# Patient Record
Sex: Male | Born: 1960 | Race: White | Hispanic: No | Marital: Married | Smoking: Former smoker
Health system: Southern US, Community
[De-identification: ages and names within clinical notes are randomized; demographics above are authoritative.]

## PROBLEM LIST (undated history)

## (undated) DIAGNOSIS — K219 Gastro-esophageal reflux disease without esophagitis: Secondary | ICD-10-CM

## (undated) DIAGNOSIS — N529 Male erectile dysfunction, unspecified: Secondary | ICD-10-CM

## (undated) DIAGNOSIS — R6882 Decreased libido: Secondary | ICD-10-CM

## (undated) DIAGNOSIS — N400 Enlarged prostate without lower urinary tract symptoms: Secondary | ICD-10-CM

## (undated) DIAGNOSIS — D171 Benign lipomatous neoplasm of skin and subcutaneous tissue of trunk: Secondary | ICD-10-CM

## (undated) DIAGNOSIS — F419 Anxiety disorder, unspecified: Secondary | ICD-10-CM

## (undated) DIAGNOSIS — H2510 Age-related nuclear cataract, unspecified eye: Secondary | ICD-10-CM

## (undated) DIAGNOSIS — R41840 Attention and concentration deficit: Secondary | ICD-10-CM

## (undated) DIAGNOSIS — E785 Hyperlipidemia, unspecified: Secondary | ICD-10-CM

## (undated) DIAGNOSIS — I10 Essential (primary) hypertension: Secondary | ICD-10-CM

## (undated) HISTORY — DX: Decreased libido: R68.82

## (undated) HISTORY — PX: COLONOSCOPY: SHX174

## (undated) HISTORY — DX: Age-related nuclear cataract, unspecified eye: H25.10

## (undated) HISTORY — DX: Benign prostatic hyperplasia without lower urinary tract symptoms: N40.0

## (undated) HISTORY — PX: OTHER SURGICAL HISTORY: SHX169

## (undated) HISTORY — DX: Anxiety disorder, unspecified: F41.9

## (undated) HISTORY — DX: Male erectile dysfunction, unspecified: N52.9

## (undated) HISTORY — DX: Hyperlipidemia, unspecified: E78.5

## (undated) HISTORY — PX: HERNIA REPAIR: SHX51

## (undated) HISTORY — DX: Gastro-esophageal reflux disease without esophagitis: K21.9

## (undated) HISTORY — PX: CORNEAL TRANSPLANT: SHX108

## (undated) HISTORY — DX: Essential (primary) hypertension: I10

## (undated) HISTORY — DX: Attention and concentration deficit: R41.840

## (undated) HISTORY — DX: Benign lipomatous neoplasm of skin and subcutaneous tissue of trunk: D17.1

## (undated) HISTORY — PX: CYST REMOVAL HAND: SHX6279

---

## 2001-02-07 ENCOUNTER — Emergency Department (HOSPITAL_COMMUNITY): Admission: EM | Admit: 2001-02-07 | Discharge: 2001-02-07 | Payer: Self-pay | Admitting: Emergency Medicine

## 2003-10-18 ENCOUNTER — Emergency Department (HOSPITAL_COMMUNITY): Admission: EM | Admit: 2003-10-18 | Discharge: 2003-10-18 | Payer: Self-pay | Admitting: Emergency Medicine

## 2008-03-25 IMAGING — CR DG CHEST 2V
2 series · 2 of 2 positions shown · non-contrast
Comparison: No priors

CLINICAL DATA: Pre admit for left inguinal hernia

CHEST - 2 VIEW

[view not recorded (1 of 2)]
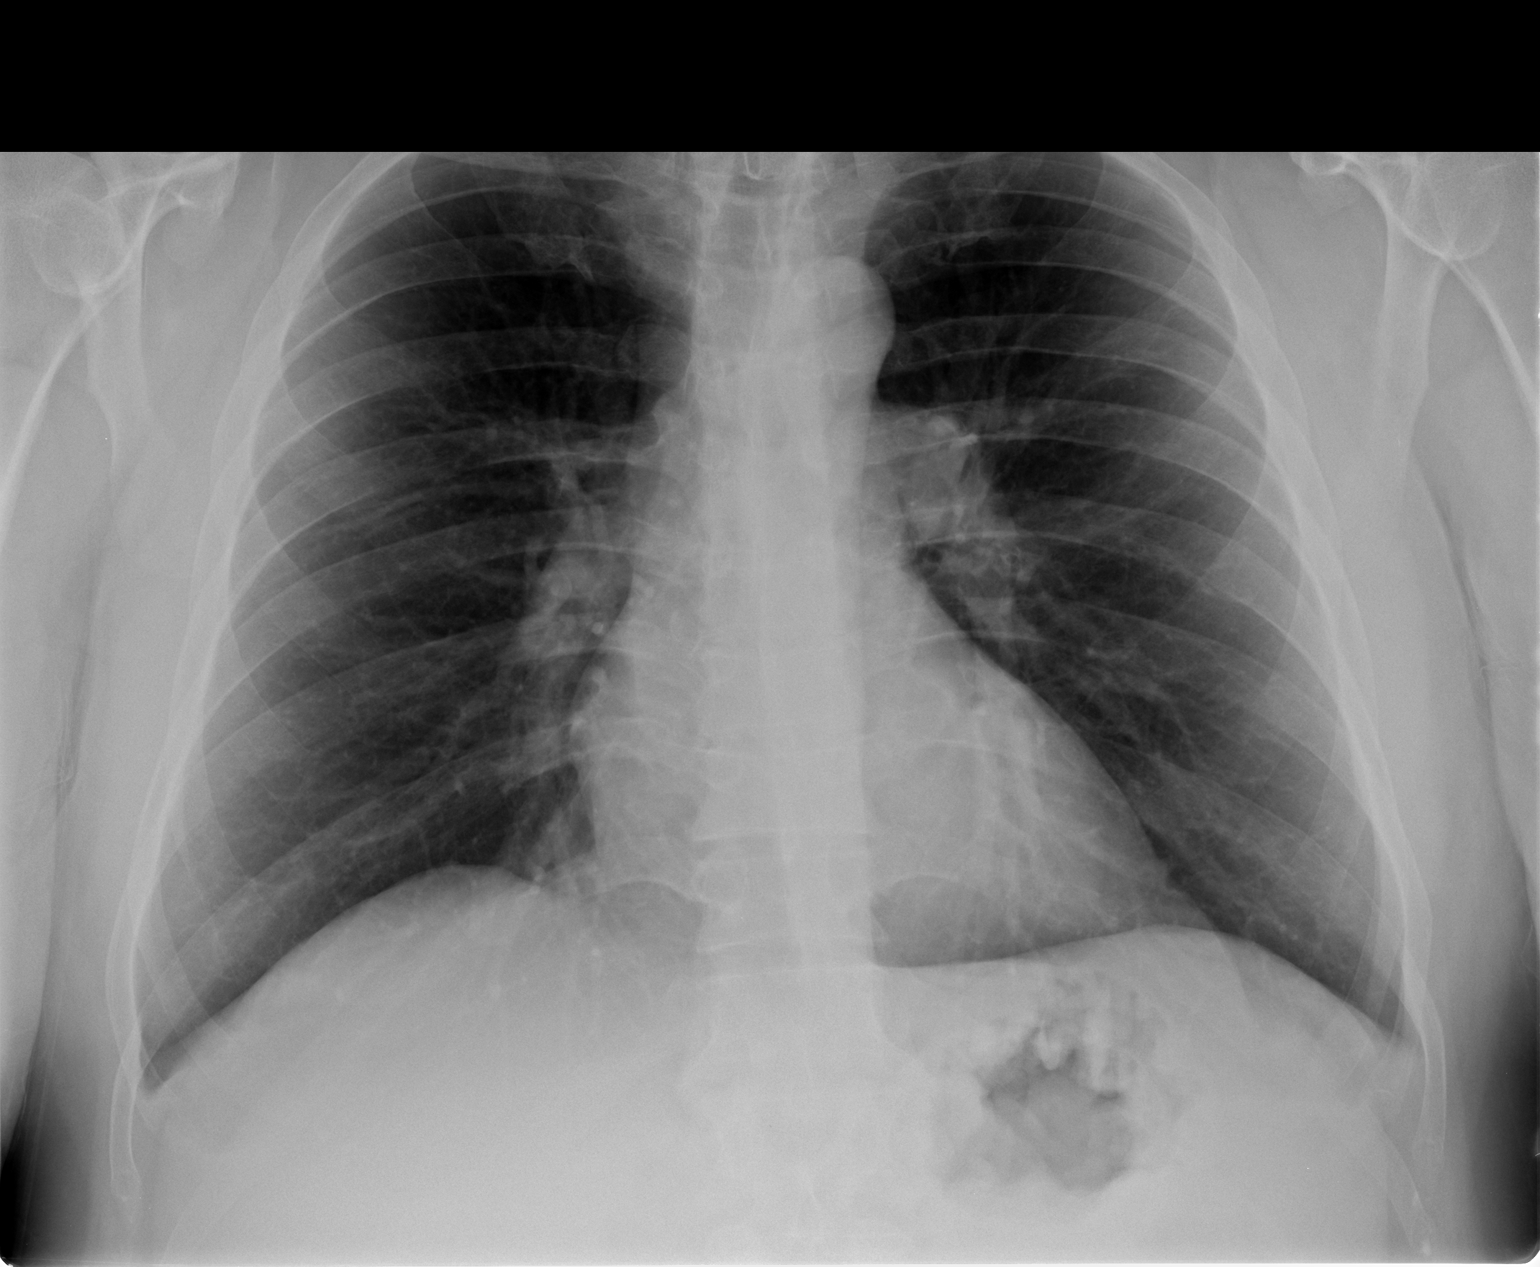

[view not recorded (2 of 2)]
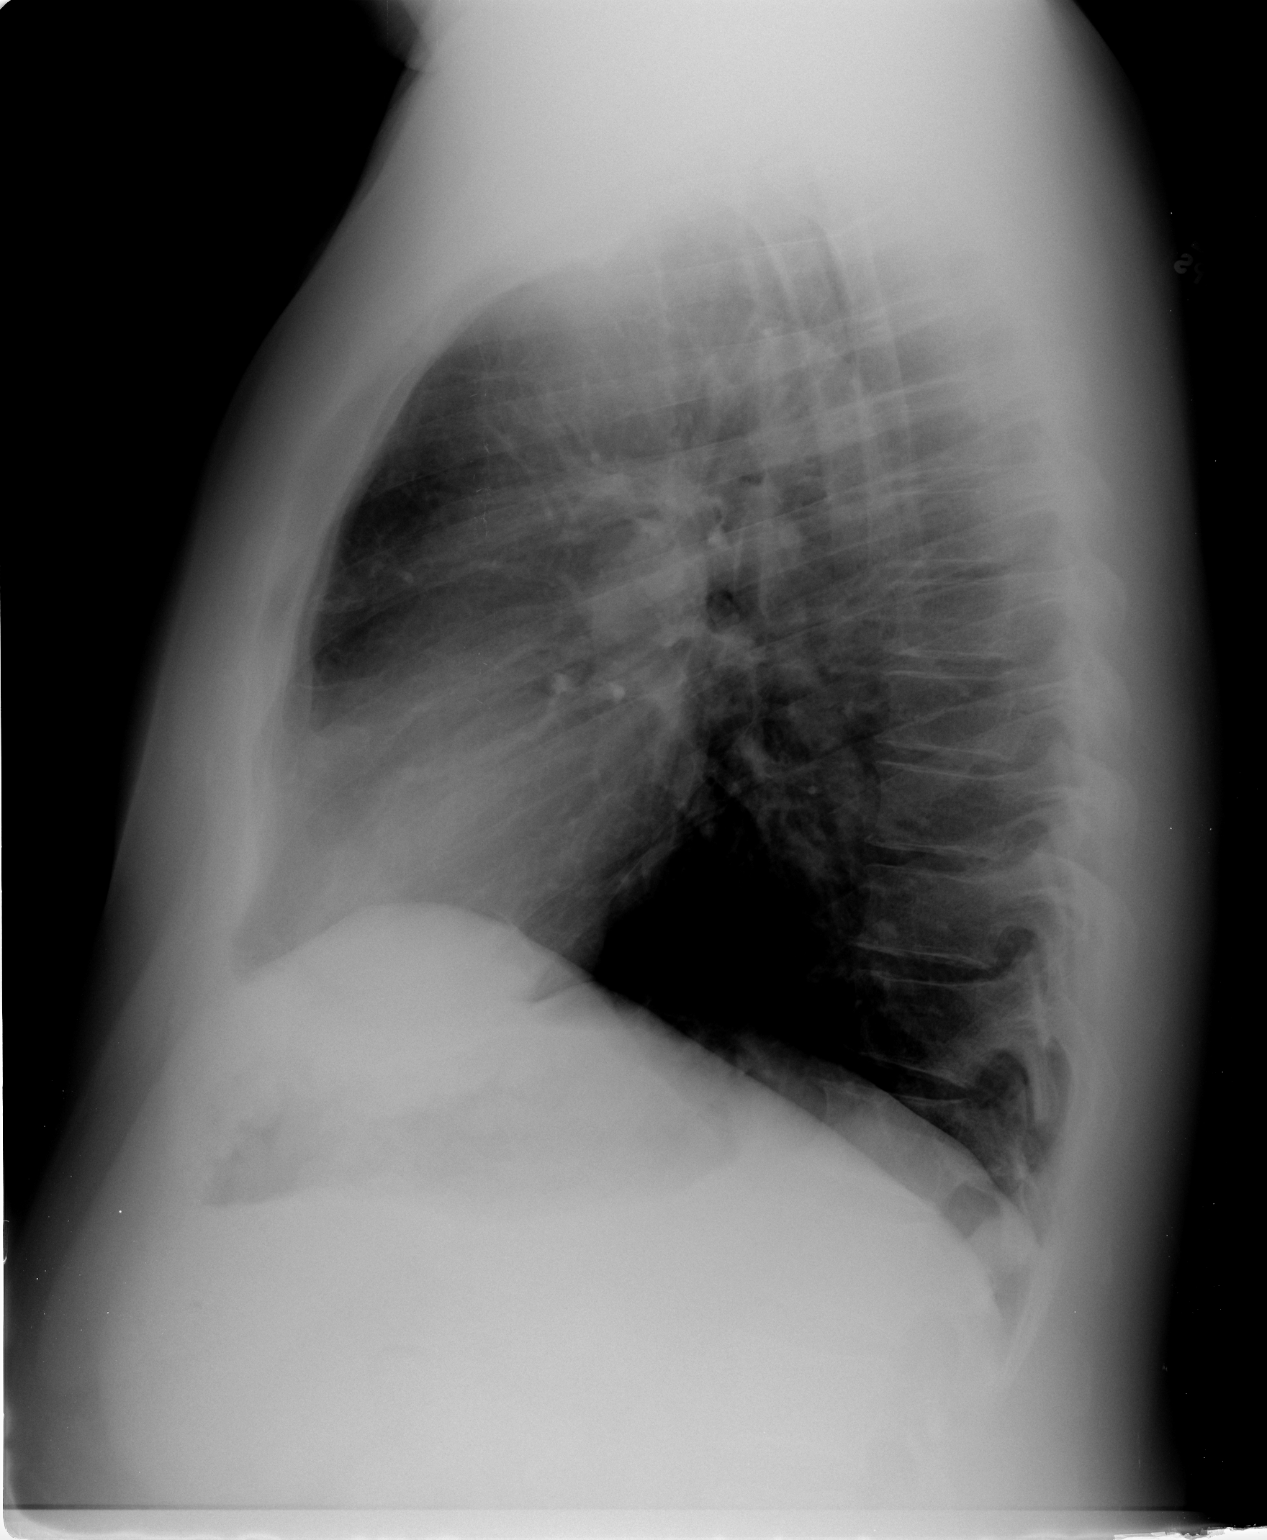

[2 of 2 positions shown; findings below may reference images not displayed]

FINDINGS: Heart and mediastinal contours normal.  Minimally
prominent left pulmonary artery which I would regard as a variant
of normal.

Lungs clear.  Osseous structures intact.
IMPRESSION: No active disease.

## 2008-03-26 ENCOUNTER — Ambulatory Visit (HOSPITAL_COMMUNITY): Admission: RE | Admit: 2008-03-26 | Discharge: 2008-03-26 | Payer: Self-pay | Admitting: Surgery

## 2008-07-01 ENCOUNTER — Encounter: Admission: RE | Admit: 2008-07-01 | Discharge: 2008-07-01 | Payer: Self-pay | Admitting: Gastroenterology

## 2008-07-01 IMAGING — CT CT PELVIS W/ CM
2 of 5 series · 17 of 46 positions shown, 19 images · IV contrast (READICAT/WATER & OMNI 300/[ID])
Comparison: None

CT ABDOMEN

CLINICAL DATA: Abdominal pain, particular the left lower quadrant
with some nausea

CT ABDOMEN AND PELVIS WITH CONTRAST
TECHNIQUE: Multidetector CT imaging of the abdomen and pelvis was
performed using the standard protocol following bolus
administration of intravenous contrast.
Contrast: 125 ml [TA]

[Series 2: abdomen w/ · axial · 0.95mm/px · z∈[-472,-46]mm · 14 of 96 slices shown, 16 images]
[im 6/96  soft-tissue]
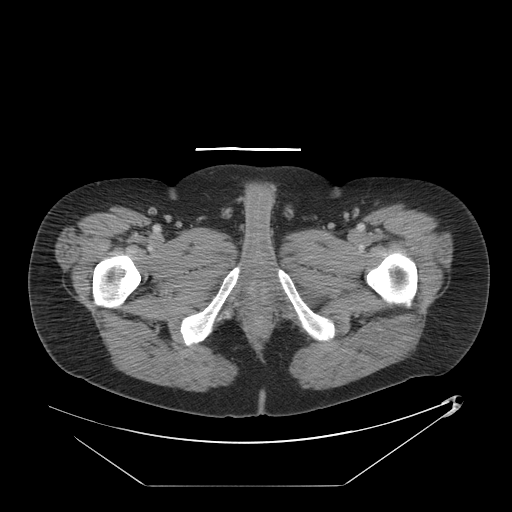
[im 6/96  bone]
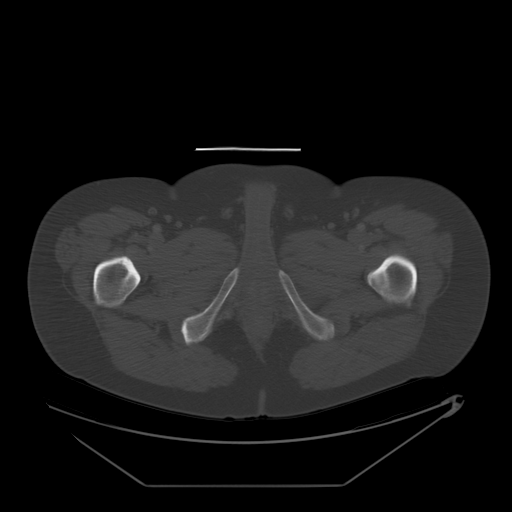
[im 11/96  soft-tissue]
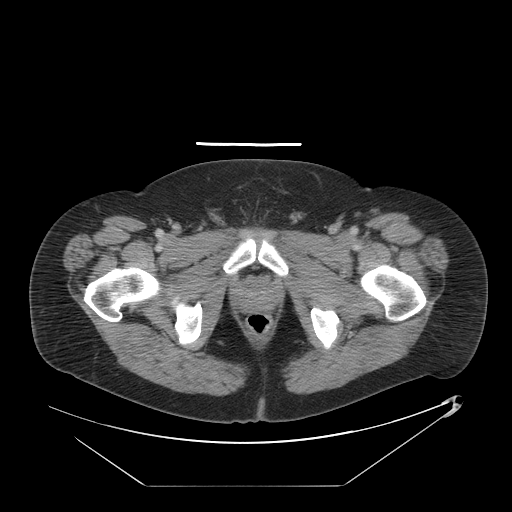
[im 21/96  soft-tissue]
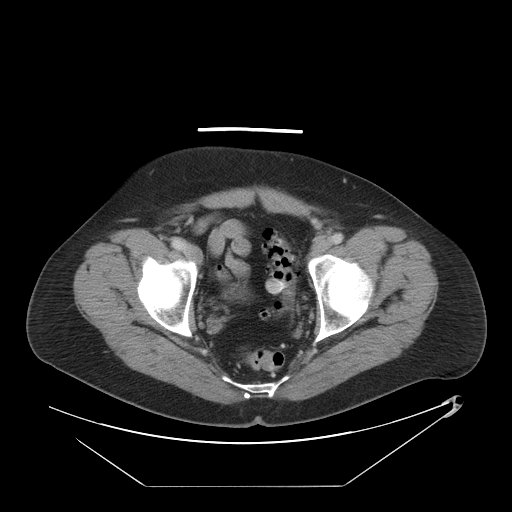
[im 26/96  soft-tissue]
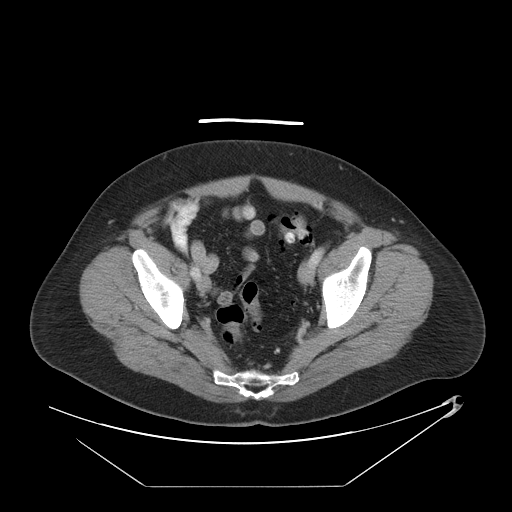
[im 31/96  soft-tissue]
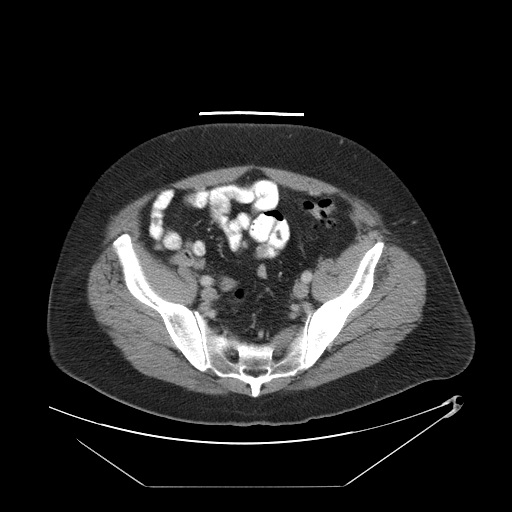
[im 41/96  soft-tissue]
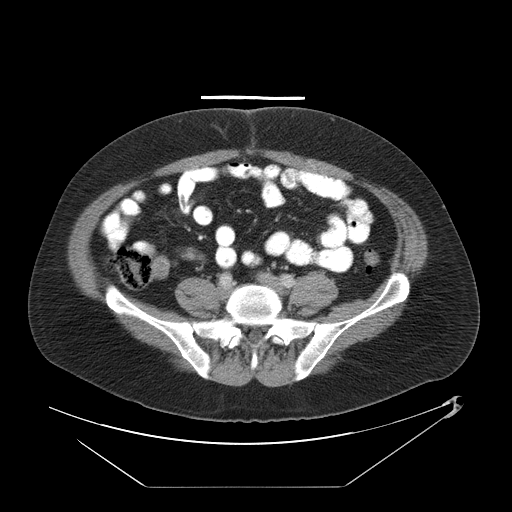
[im 46/96  soft-tissue]
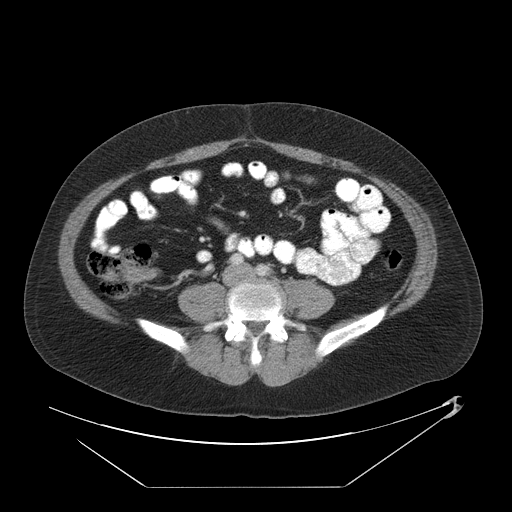
[im 51/96  soft-tissue]
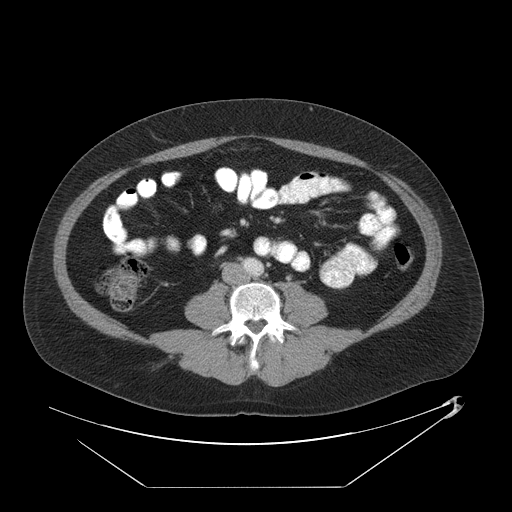
[im 56/96  soft-tissue]
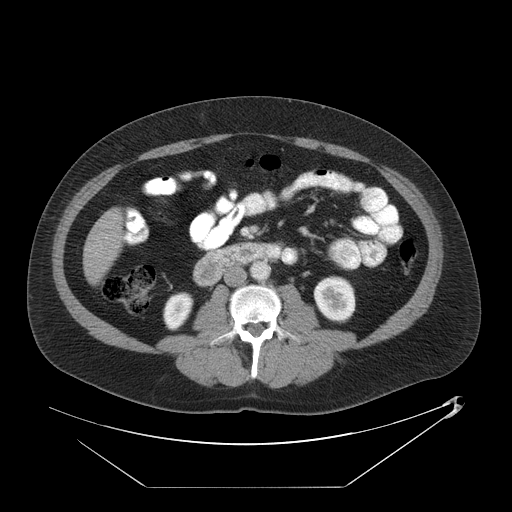
[im 56/96  bone]
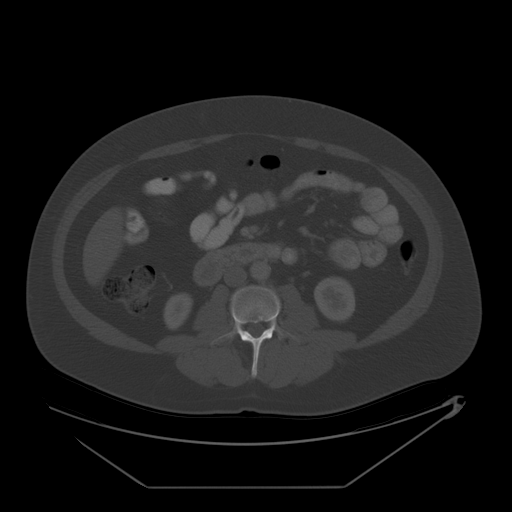
[im 66/96  soft-tissue]
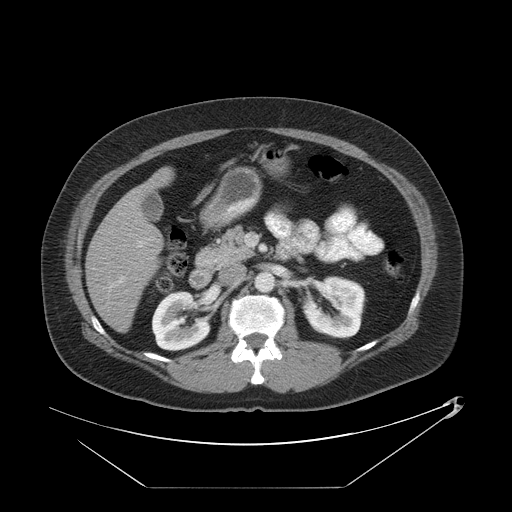
[im 71/96  soft-tissue]
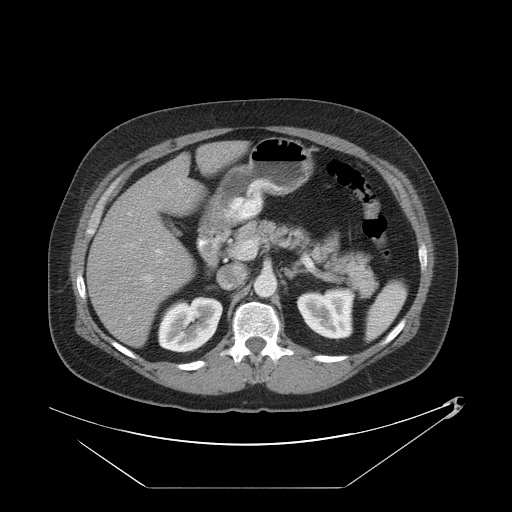
[im 76/96  soft-tissue]
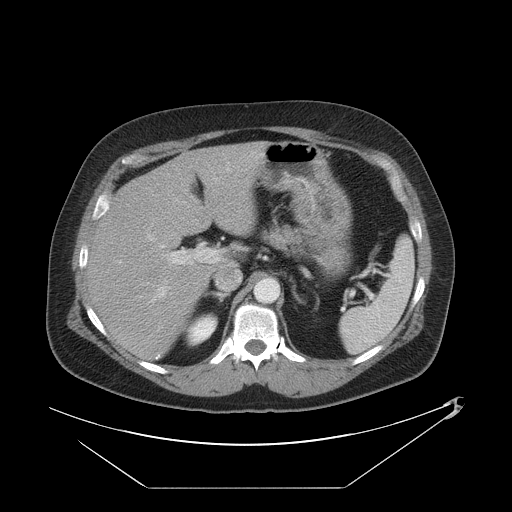
[im 86/96  soft-tissue]
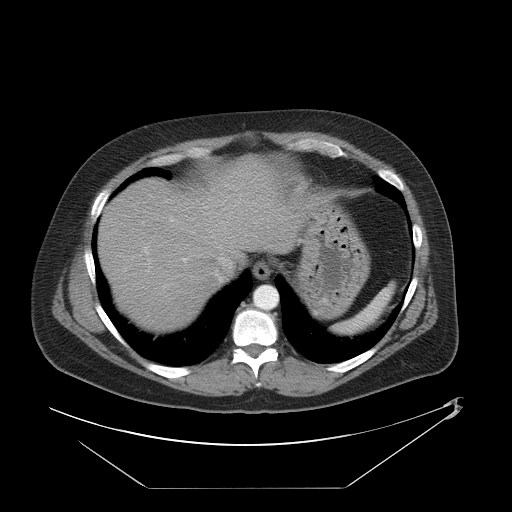
[im 91/96  soft-tissue]
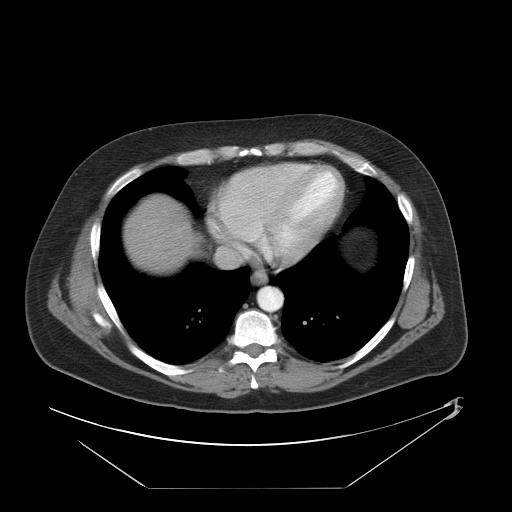

[Series 401: cor · coronal · 0.95mm/px · 3 of 122 slices shown]
[im 41/122  soft-tissue]
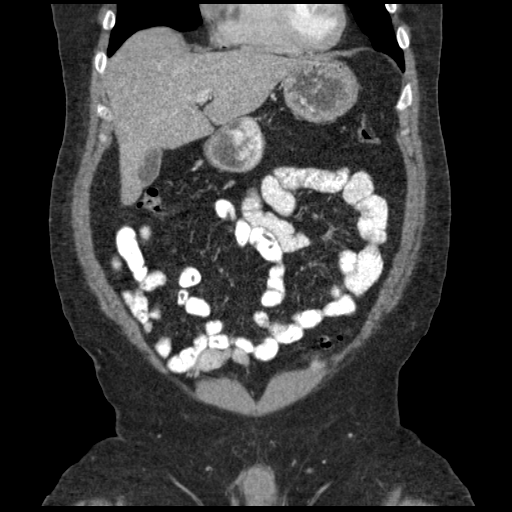
[im 54/122  soft-tissue]
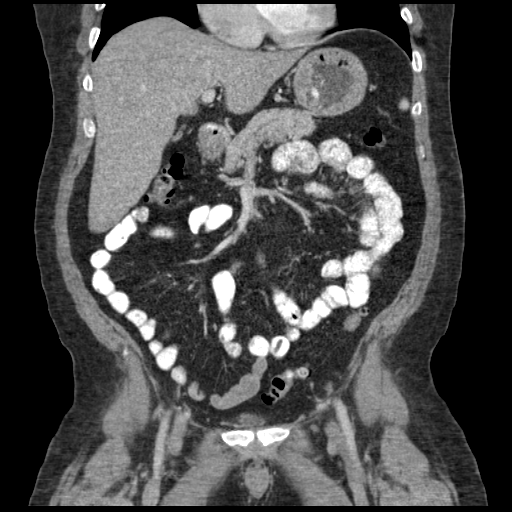
[im 68/122  soft-tissue]
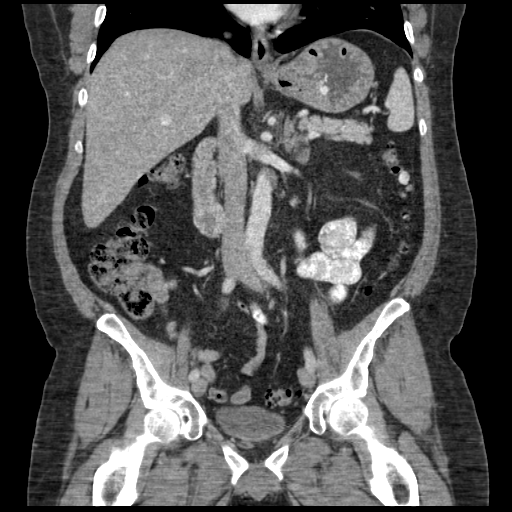

[17 of 46 positions shown; findings below may reference images not displayed]

FINDINGS: The lung bases are clear.  The liver enhances with no
focal abnormality.  There are a few small calcifications
posteriorly in the right lobe of liver possibly related to prior
granulomatous disease.  No calcified gallstones are seen.  The
pancreas is normal in size and the pancreatic duct is not dilated.
The adrenal glands and spleen appear normal.  The kidneys enhance
and on delayed images the pelvocaliceal systems appear normal.  The
abdominal aorta is normal in caliber.  No adenopathy is seen.
IMPRESSION: Negative CT the abdomen.  There are a few hepatic calcifications
possibly related to prior granulomatous disease.

CT PELVIS
FINDINGS: There are scattered diverticula throughout the colon
primarily concentrated within the rectosigmoid colon.  However, no
diverticulitis is seen.  The urinary bladder is unremarkable.  The
prostate is normal in size.  No fluid, mass, or adenopathy is noted
within the pelvis.  The appendix and terminal ileum appear normal.
No bony abnormality is seen.
IMPRESSION: 1.  Multiple scattered diverticula concentrated primarily in the
rectosigmoid colon.  No evidence of acute diverticulitis is seen.
 2.  Appendix and terminal ileum appear normal.

## 2011-01-17 NOTE — Op Note (Signed)
Chad Tran, Chad Tran                 ACCOUNT NO.:  [id number]   MEDICAL RECORD NO.:  [id number]          PATIENT TYPE:  AMB   LOCATION:  SDS                          FACILITY:  [facility]   PHYSICIAN:  Thomas A. Cornett, M.D.DATE OF BIRTH:  May 29, 1961   DATE OF PROCEDURE:  03/26/2008  DATE OF DISCHARGE:  03/26/2008                               OPERATIVE REPORT   PREOPERATIVE DIAGNOSES:  1. Left inguinal hernia.  2. Left-sided abdominal pain.   POSTOPERATIVE DIAGNOSES:  1. Left inguinal hernia.  2. Left-sided abdominal pain.   PROCEDURE:  1. Preperitoneal left inguinal hernia repair with mesh.  2. Diagnostic laparoscopy.   SURGEON:  Maisie Fus A. Cornett, MD.   ASSISTANT:  Donalda Ewings, PA student.   ANESTHESIA:  General endotracheal anesthesia 0.25% Sensorcaine local.   ESTIMATED BLOOD LOSS:  20 mL.   SPECIMENS:  None.   INDICATIONS FOR PROCEDURE:  The patient is a 50 year old male who has  been worked up with Dr. Jeani Hawking for abdominal pain.  On his workup,  he was found to have a left inguinal hernia and that was picked up on CT  scan.  This was not really noticed by the patient, nor picked up on  physical exam, so I saw him in the office, it was quite small in nature.  He continued to have left-sided pain, but given the fact that this  continued, we talked about options.  We felt that laparoscopic repair of  his hernia could be done, at the same time doing a diagnostic  laparoscopy to exclude any other cause.Marland Kitchen  He does have a history of  diverticulosis in the past.  I talked to him about all the risks as well  as potential complications of bleeding and infection.  I also talked him  about doing an open repair as well, but he liked the idea of  laparoscopic repair  to get him back to work faster and also that a  laparoscopy could be performed at the same time.  After discussing all  these with him, he agreed to proceed.   DESCRIPTION OF PROCEDURE:  The patient was brought  to the operating room  and placed supine.  After induction of general anesthesia, Foley  catheter was placed and his right arm was tucked.  The abdomen was then  prepped and draped in sterile fashion.  A 1 cm infraumbilical incision  was made.  Dissection was carried down toward his left rectus sheath.  I  opened the anterior sheath of the left rectus muscle and then retracted  the muscle laterally to expose the posterior sheath.  I then was able to  place my finger and opened the space up.  A Spacemaker balloon trocar  was then placed, and this was insufflated in the preperitoneal space  under direct vision to open up the left preperitoneal space.  It did not  open the right side very well.  After we inflated the balloon, I removed  the balloon, inflated the balloon of the trocar and insufflated to about  12 mmHg of CO2.  The left  side opened up quite nicely with the balloon.  There were some adhesions, though,  from the midline and I had to take  graspers to open this up myself directly.  After doing this, I then  dissected out the left internal ring.  Cord structures were identified.  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  His internal  ring was somewhat loose.  No evidence of an indirect hernia.  I then  inspected the right side as best I could and saw no evidence of  recurrent hernia in the right side.  We were able to see the bladder and  stay away from this.  At this point, after opening the space up and  dissecting out the cord structures, a piece of UltraPro mesh was cut  roughly into configuration of 4 inches x 6 inches.  I then cut a small  slit such that I could tuck piece of the mesh up under the pubic  tubercle, anterior to the bladder.  I then introduced the mesh, laid the  mesh out to cover the both internal ring and indirect inguinal floor, I  was able to tuck the mesh down under the  pubic tubercle and pubic  symphysis carefully.  We then laid the mesh out laterally and laid out  quite nicely.  Tisseel was used to secure the mesh and this worked very  nicely to keep the mesh in place.  Hemostasis was excellent.  I saw no  evidence of injury to the bladder or any other preperitoneal structures.  I then let the CO2 escape and washed the space close and the mesh stayed  in good position.  At this point in time, I then decided to open the  abdominal cavity.  We were able to use two Kochers and grab the midline  and pull it up.  A 15 blade was then used and I opened the fascia.  The  preperitoneal space was opened with my finger.  A pursestring suture of  0-Vicryl was placed and a 12-mm Hasson cannula was placed under direct  vision.  Pneumoperitoneum was then created to 15 mmHg CO2 in the  abdominal cavity.  We then did diagnostic laparoscopy.  The left  inguinal repair site looked fine.  The right inguinal canal showed no  evidence of hernia on the right side again.  The left upper quadrant  revealed no abnormality.  The colon, liver, and stomach were grossly  normal.  I saw no evidence of left upper quadrant hernia.  The small  bowel was grossly normal.  The right upper quadrant was normal as normal  gallbladder, normal right lobe of the liver.  I saw no explanation for  his left upper quadrant pain.  He did have diverticulosis, but no  evidence of diverticulitis.  At this point time, I terminated the  procedure and removed the scope and allowed the CO2 to escape from the  peritoneal cavity.  We then closed the fascia with 0-Vicryl.  A 4-0  Monocryl was used to close all skin incisions.  The two midline 5-mm  ports were also closed with 4-0 Monocryl.  All final counts of sponge  and needle instruments were found to be correct at this portion of the  case.  The patient was then awoke, taken to recovery in satisfactory  condition after placement of Dermabond on incisions.   All final  counts  were found to be correct x2.  The Foley catheter was removed.  The  patient tolerated the procedure well and was taken to recovery.      Thomas A. Cornett, M.D.  Electronically Signed     TAC/MEDQ  D:  03/26/2008  T:  03/27/2008  Job:  [id number]   cc:   Jordan Hawks. Elnoria Howard, MD

## 2011-06-02 LAB — DIFFERENTIAL
Basophils Absolute: 0.1
Basophils Relative: 1
Eosinophils Absolute: 0.2
Eosinophils Relative: 3
Lymphocytes Relative: 23
Lymphs Abs: 1.7
Monocytes Absolute: 0.7
Monocytes Relative: 9
Neutro Abs: 4.8
Neutrophils Relative %: 64

## 2011-06-02 LAB — CBC
HCT: 47.3
Hemoglobin: 15.7
MCHC: 33.1
MCV: 95.4
Platelets: 252
RBC: 4.96
RDW: 13.6
WBC: 7.5

## 2011-06-02 LAB — BASIC METABOLIC PANEL
BUN: 18
CO2: 24
Calcium: 9.9
Chloride: 107
Creatinine, Ser: 0.7
GFR calc Af Amer: >60
GFR calc non Af Amer: >60
Glucose, Bld: 92
Potassium: 5.1
Sodium: 138

## 2012-06-05 ENCOUNTER — Other Ambulatory Visit: Payer: Self-pay | Admitting: Family Medicine

## 2012-06-05 DIAGNOSIS — R109 Unspecified abdominal pain: Secondary | ICD-10-CM

## 2012-06-06 ENCOUNTER — Ambulatory Visit
Admission: RE | Admit: 2012-06-06 | Discharge: 2012-06-06 | Disposition: A | Discharge status: Home / Self Care | Payer: PRIVATE HEALTH INSURANCE | Source: Ambulatory Visit | Attending: Family Medicine | Admitting: Family Medicine

## 2012-06-06 DIAGNOSIS — R109 Unspecified abdominal pain: Secondary | ICD-10-CM

## 2012-06-06 IMAGING — CT CT ABD-PELV W/ CM
2 of 5 series · 16 of 46 positions shown, 18 images · IV contrast (READICAT/WATER & [ID] OMNI 300)
Comparison: CT abdomen pelvis of [DATE]

CLINICAL DATA: Left sided abdominal pain and cramping, some nausea,
worse over last 2 weeks

CT ABDOMEN AND PELVIS WITH CONTRAST
TECHNIQUE: Multidetector CT imaging of the abdomen and pelvis was
performed following the standard protocol during bolus
administration of intravenous contrast.
Contrast: 125mL OMNIPAQUE IOHEXOL 300 MG/ML  SOLN

[Series 2: abd/pelvis with · axial · 0.88mm/px · z∈[-381,+34]mm · 13 of 93 slices shown, 15 images]
[im 5/93  soft-tissue]
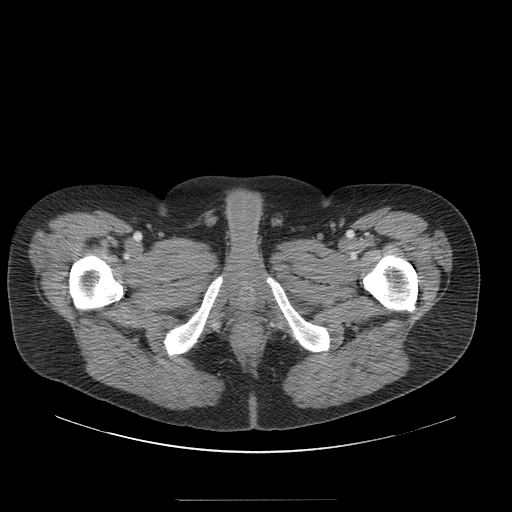
[im 5/93  bone]
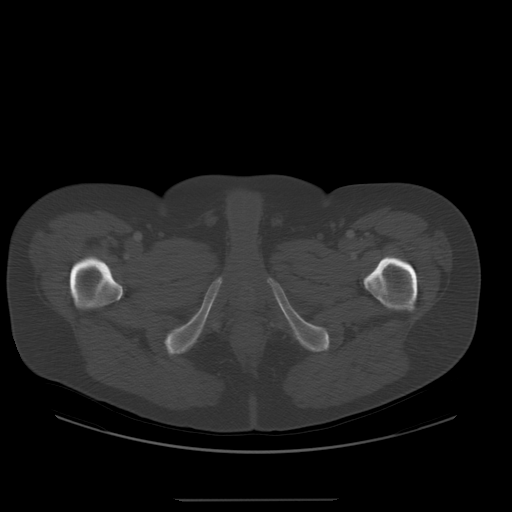
[im 15/93  soft-tissue]
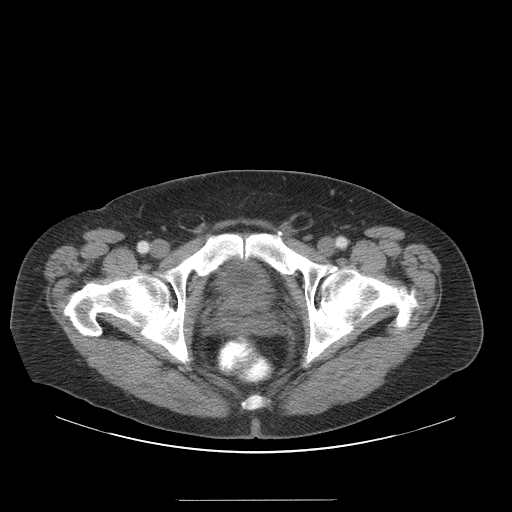
[im 20/93  soft-tissue]
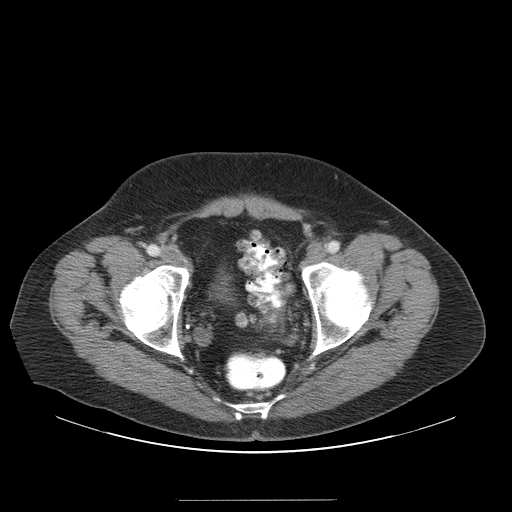
[im 25/93  soft-tissue]
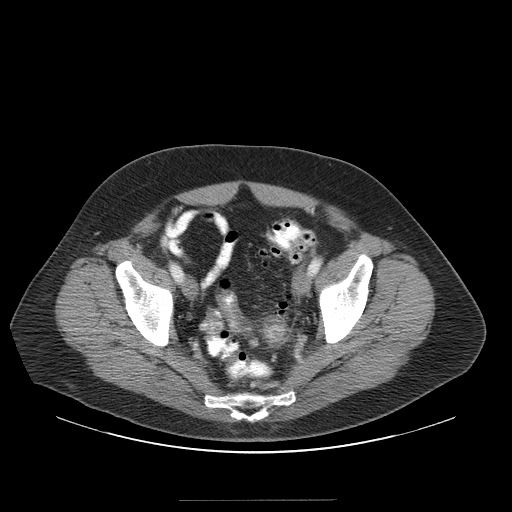
[im 34/93  soft-tissue]
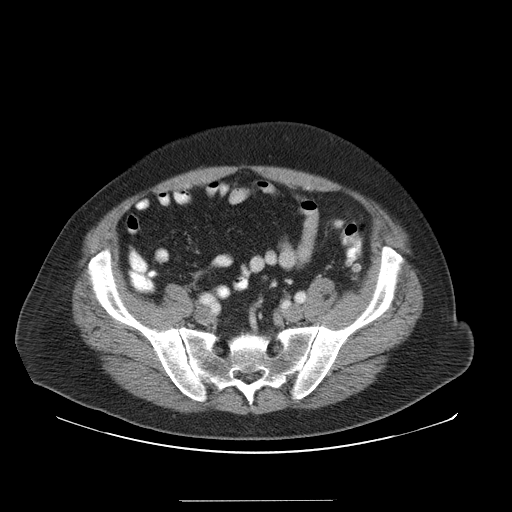
[im 39/93  soft-tissue]
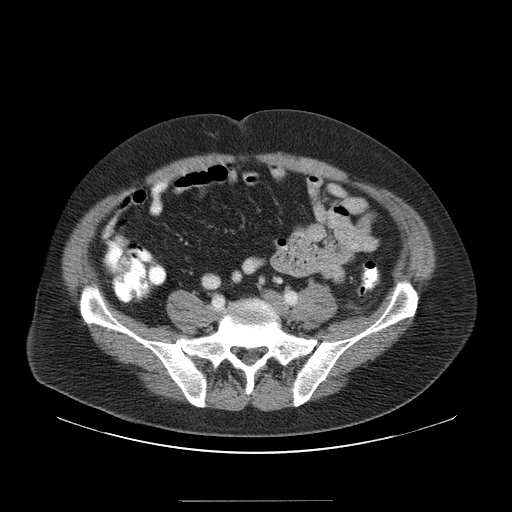
[im 49/93  soft-tissue]
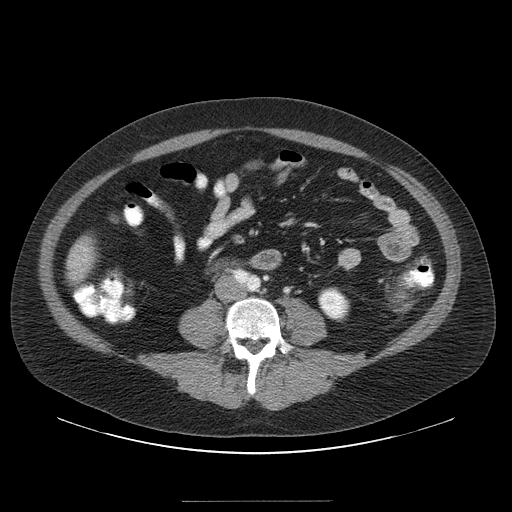
[im 54/93  soft-tissue]
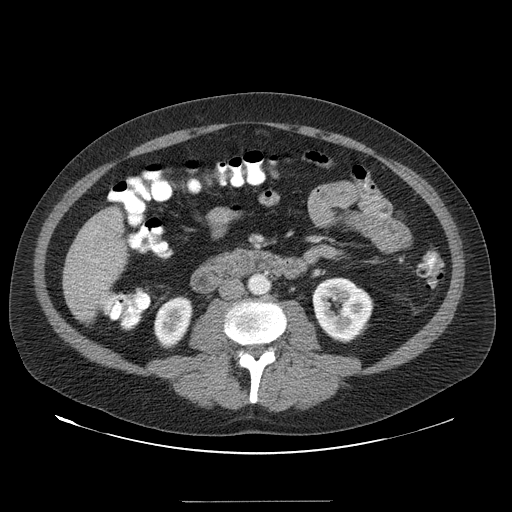
[im 59/93  soft-tissue]
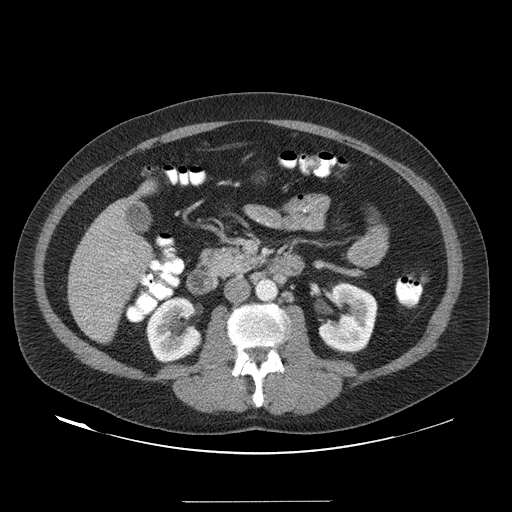
[im 59/93  bone]
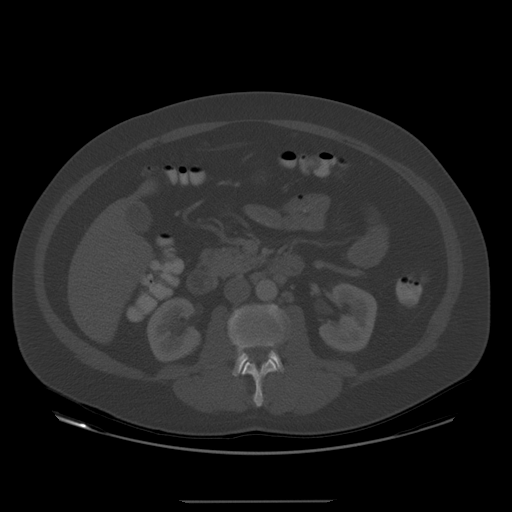
[im 68/93  soft-tissue]
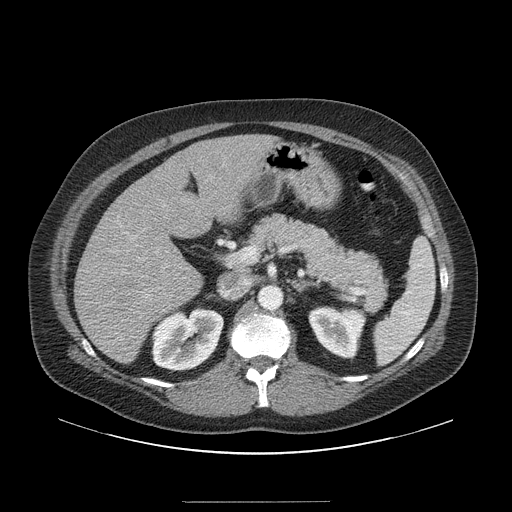
[im 73/93  soft-tissue]
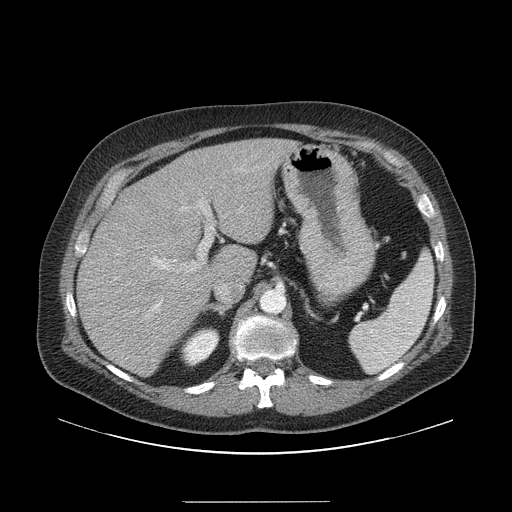
[im 78/93  soft-tissue]
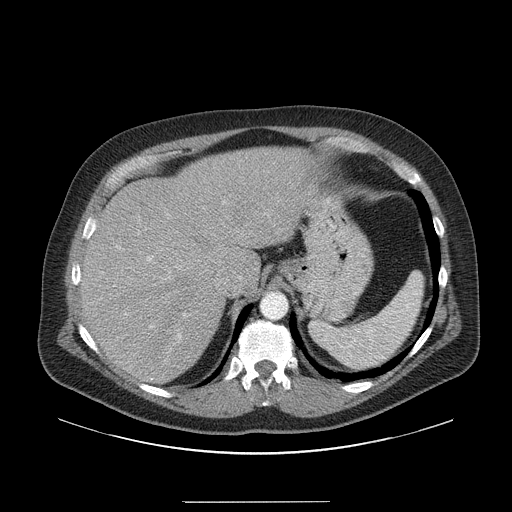
[im 88/93  soft-tissue]
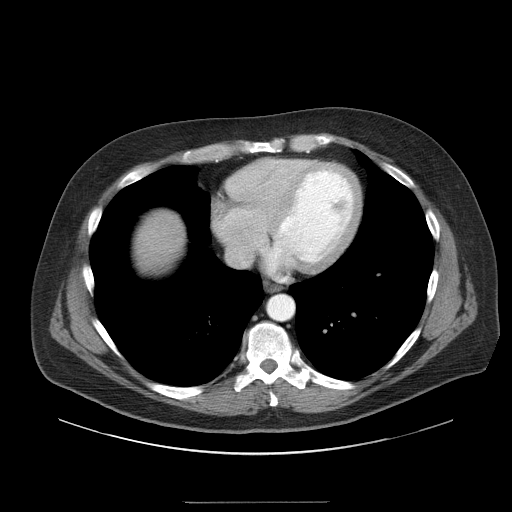

[Series 400: coronal · coronal · 0.92mm/px · 3 of 132 slices shown]
[im 44/132  soft-tissue]
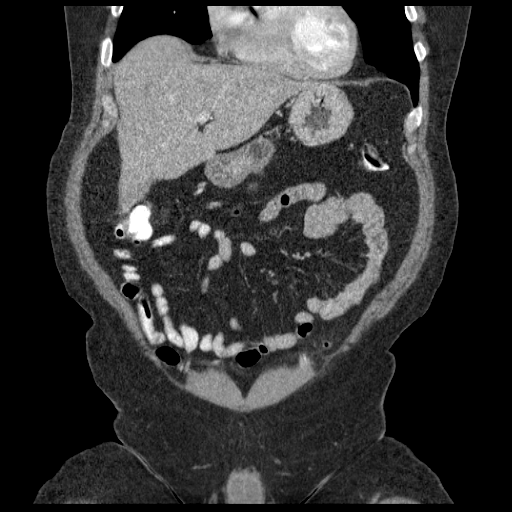
[im 59/132  soft-tissue]
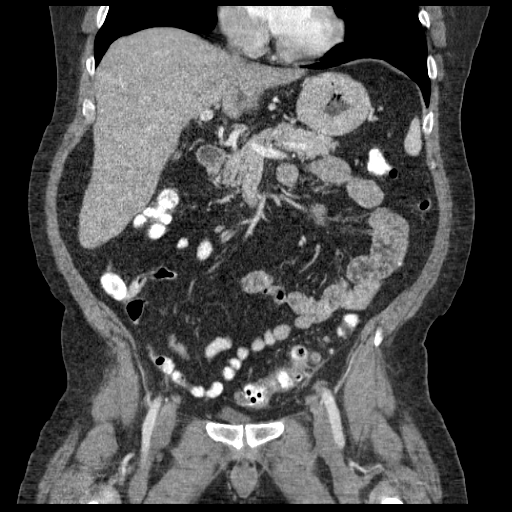
[im 73/132  soft-tissue]
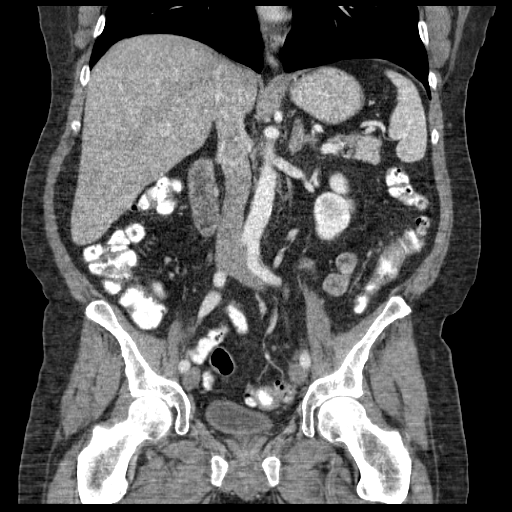

[16 of 46 positions shown; findings below may reference images not displayed]

FINDINGS: The lung bases are clear.  The liver enhances with no
focal abnormality.  The liver is somewhat low in attenuation and
fatty infiltration is a consideration. Comparison with liver
function tests is recommended. Small calcifications again noted in
the posterior right lobe most consistent with prior granulomatous
disease.    No focal abnormality is seen.  No calcified gallstones
are noted.  The pancreas is normal in size and the pancreatic duct
is not dilated.  The adrenal glands and spleen are unremarkable.
The stomach is not well distended.  The kidneys enhance with no
calculus or mass and no hydronephrosis is seen.  The abdominal
aorta is normal in caliber and the mesenteric vasculature appears
patent.  No adenopathy is seen.  Only small retroperitoneal nodes
are present.

There are multiple rectosigmoid colonic diverticula present.  There
is a focal area of the sigmoid colon where there is mild mucosal
thickening and slight pericolonic strandiness with some enhancement
of adjacent diverticula suggesting mild diverticulitis.  No
complicating features are seen.  Mild thickening of the lateral
conal fascia is noted in the left abdomen as well which may
indicate perhaps a second focus of mild diverticulitis in the mid
descending colon.  The remainder of the colon is well visualized
with no additional abnormality.  The terminal ileum and appendix
are unremarkable.  No fluid is seen within the pelvis.  The urinary
bladder is not well distended but appears unremarkable and the
prostate is within normal limits in size for age. No bony
abnormality is seen.
IMPRESSION: 1.  Probable mild acute diverticulitis of the mid descending colon
and possibly a small focus within the sigmoid colon.  No
complicating features.  Multiple rectosigmoid and descending colon
diverticula.
2.  Probable fatty infiltration of the liver.

## 2012-06-06 MED ORDER — IOHEXOL 300 MG/ML  SOLN
125.0000 mL | Freq: Once | INTRAMUSCULAR | Status: AC | PRN
  Administered 2012-06-06: 125 mL via INTRAVENOUS

## 2019-02-27 ENCOUNTER — Telehealth: Payer: Self-pay

## 2019-02-27 NOTE — Telephone Encounter (Signed)
NOTES ON FILE FROM [facility] [phone], SENT REFERRAL TO SCHEDULING °

## 2019-03-13 ENCOUNTER — Other Ambulatory Visit: Payer: Self-pay | Admitting: Physician Assistant

## 2019-03-13 DIAGNOSIS — G454 Transient global amnesia: Secondary | ICD-10-CM

## 2019-03-13 DIAGNOSIS — R42 Dizziness and giddiness: Secondary | ICD-10-CM

## 2019-03-28 ENCOUNTER — Ambulatory Visit
Admission: RE | Admit: 2019-03-28 | Discharge: 2019-03-28 | Disposition: A | Discharge status: Home / Self Care | Payer: PRIVATE HEALTH INSURANCE | Source: Ambulatory Visit | Attending: Physician Assistant | Admitting: Physician Assistant

## 2019-03-28 ENCOUNTER — Other Ambulatory Visit: Payer: Self-pay

## 2019-03-28 DIAGNOSIS — R42 Dizziness and giddiness: Secondary | ICD-10-CM

## 2019-03-28 DIAGNOSIS — G454 Transient global amnesia: Secondary | ICD-10-CM

## 2019-03-28 IMAGING — MR MRI HEAD WITHOUT CONTRAST
11 series · 48 of 48 positions shown · non-contrast
Comparison: None.

CLINICAL DATA: Transient global amnesia. Dizziness and falls for 1
month.

EXAM:
MRI HEAD WITHOUT CONTRAST
TECHNIQUE: Multiplanar, multiecho pulse sequences of the brain and surrounding
structures were obtained without intravenous contrast.

[Series 2: t1_se_sag · sagittal · 5.0mm · 0.45mm/px · 2 of 21 slices shown]
[im 1/21]
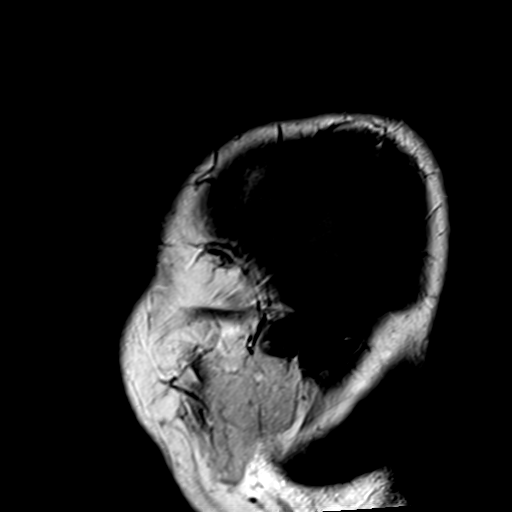
[im 21/21]
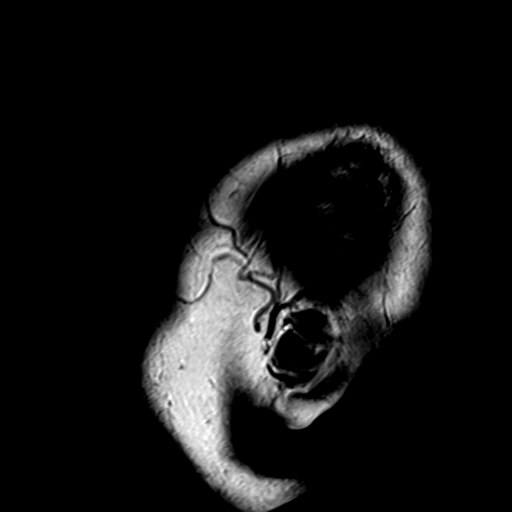

[Series 3: ep2d_diff_3 · axial · 3.0mm · 1.88mm/px · z∈[-50,+91]mm · 8 of 96 slices shown]
[im 1/96]
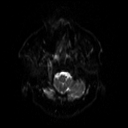
[im 14/96]
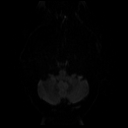
[im 28/96]
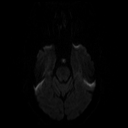
[im 41/96]
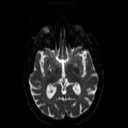
[im 55/96]
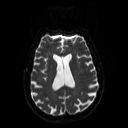
[im 68/96]
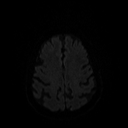
[im 82/96]
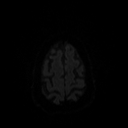
[im 96/96]
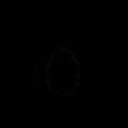

[Series 4: ep2d_diff_3_adc · axial · 3.0mm · 1.88mm/px · z∈[-50,+91]mm · 4 of 48 slices shown]
[im 1/48]
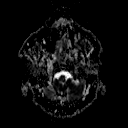
[im 16/48]
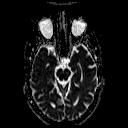
[im 32/48]
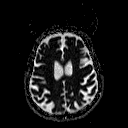
[im 48/48]
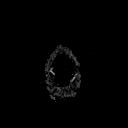

[Series 5: ep2d_diff_cor · coronal · 5.0mm · 1.77mm/px · 4 of 48 slices shown]
[im 1/48]
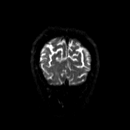
[im 16/48]
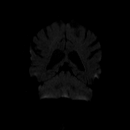
[im 32/48]
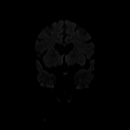
[im 48/48]
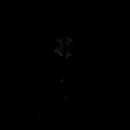

[Series 6: ep2d_diff_cor_adc · coronal · 5.0mm · 1.77mm/px · 2 of 24 slices shown]
[im 1/24]
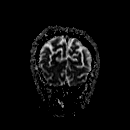
[im 24/24]
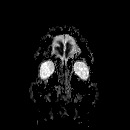

[Series 8: swi_images · axial · 2.0mm · 0.94mm/px · z∈[-58,+100]mm · 7 of 80 slices shown]
[im 1/80]
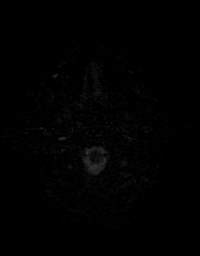
[im 14/80]
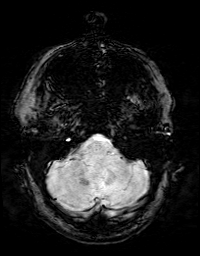
[im 27/80]
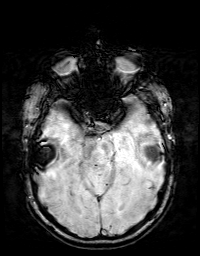
[im 40/80]
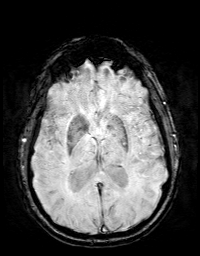
[im 53/80]
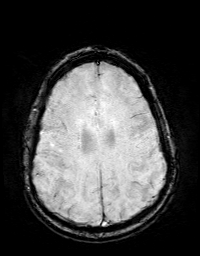
[im 66/80]
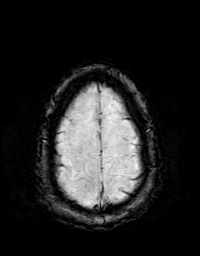
[im 80/80]
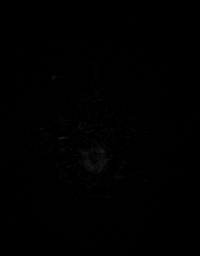

[Series 9: FLAIR · axial · 3.0mm · 0.47mm/px · z∈[-58,+98]mm · 2 of 27 slices shown (1 of 2)]
[im 1/27]
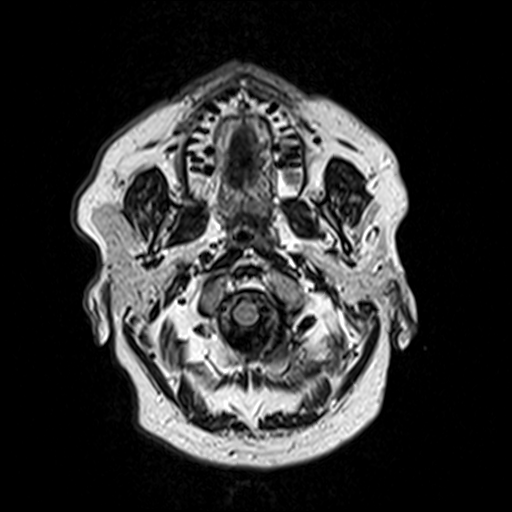
[im 27/27]
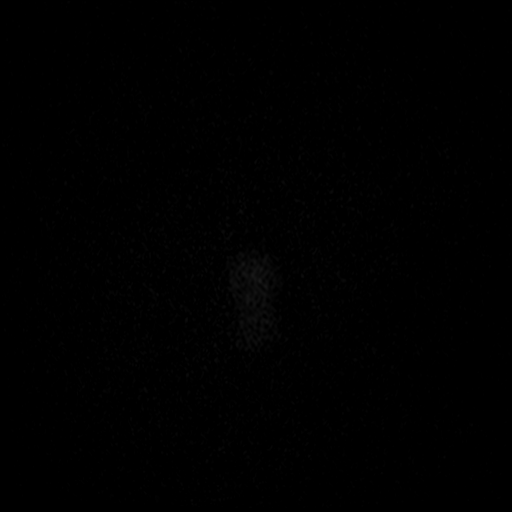

[Series 10: t2_tse_tra_512 · axial · 5.0mm · 0.62mm/px · z∈[-46,+92]mm · 2 of 24 slices shown]
[im 1/24]
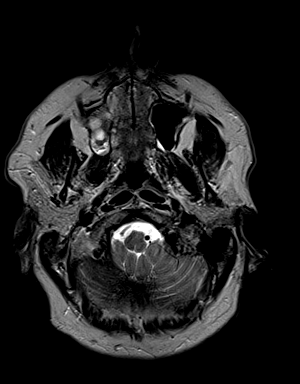
[im 24/24]
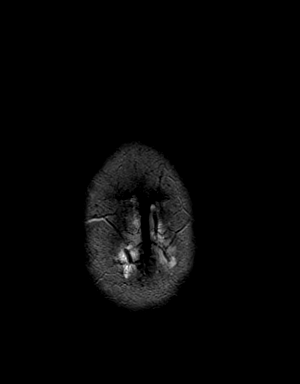

[Series 11: t1_mpr_tra · axial · 1.0mm · 0.75mm/px · z∈[-56,+103]mm · 13 of 160 slices shown]
[im 1/160]
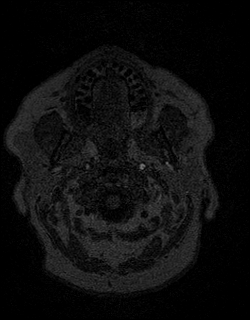
[im 14/160]
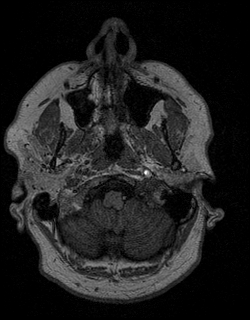
[im 27/160]
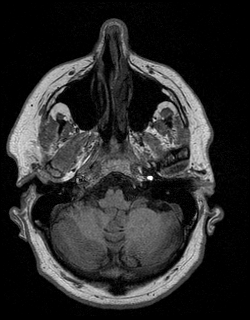
[im 40/160]
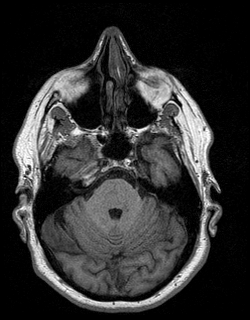
[im 54/160]
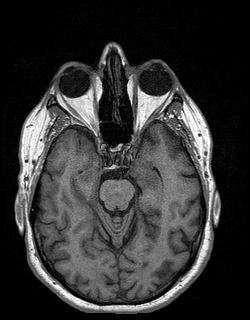
[im 67/160]
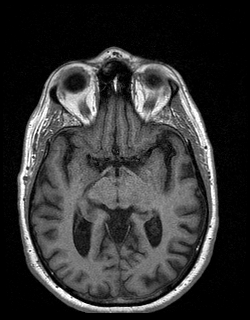
[im 80/160]
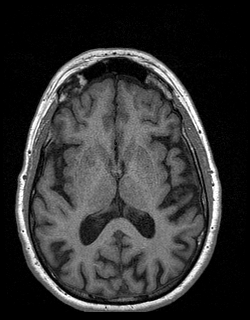
[im 93/160]
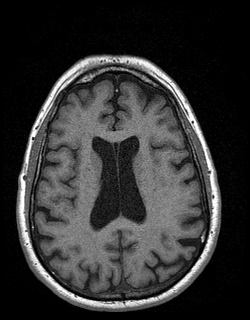
[im 107/160]
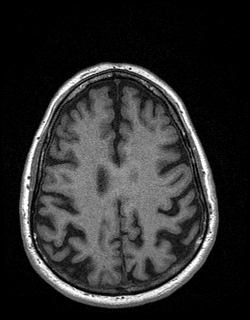
[im 120/160]
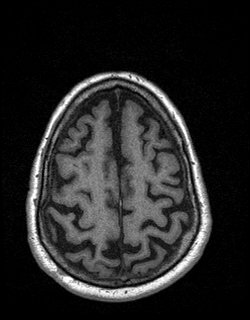
[im 133/160]
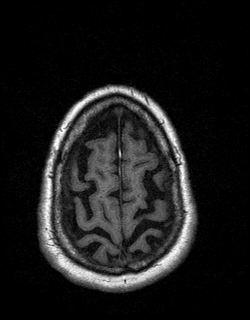
[im 146/160]
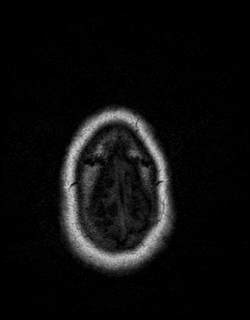
[im 160/160]
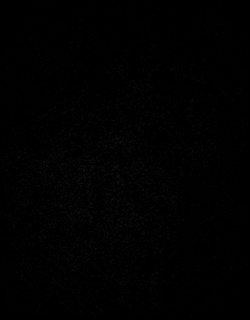

[Series 12: T2 · coronal · 5.0mm · 0.45mm/px · 2 of 26 slices shown]
[im 1/26]
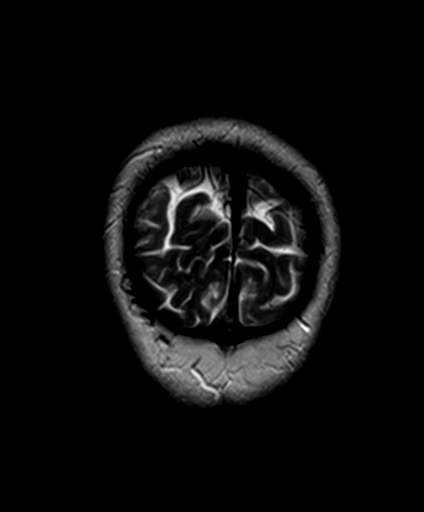
[im 26/26]
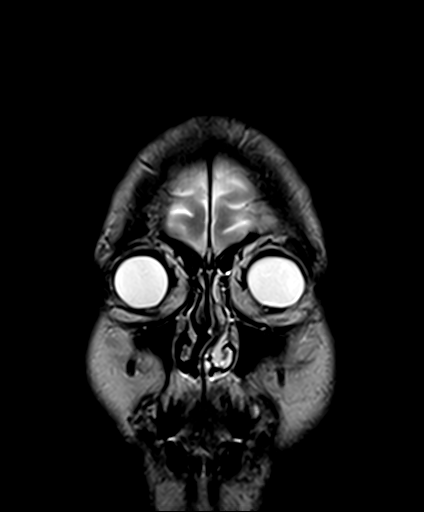

[Series 13: FLAIR · sagittal · 5.0mm · 0.49mm/px · 2 of 25 slices shown (2 of 2)]
[im 1/25]
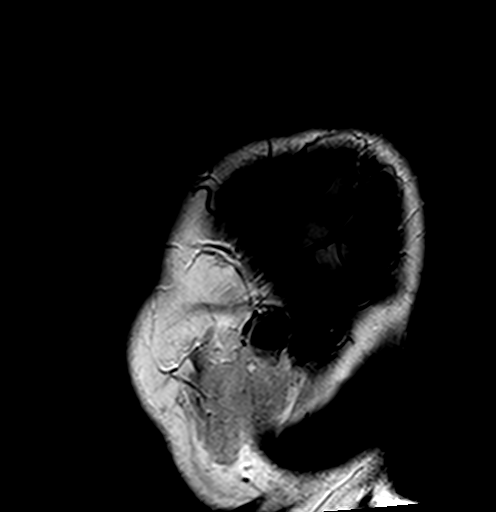
[im 25/25]
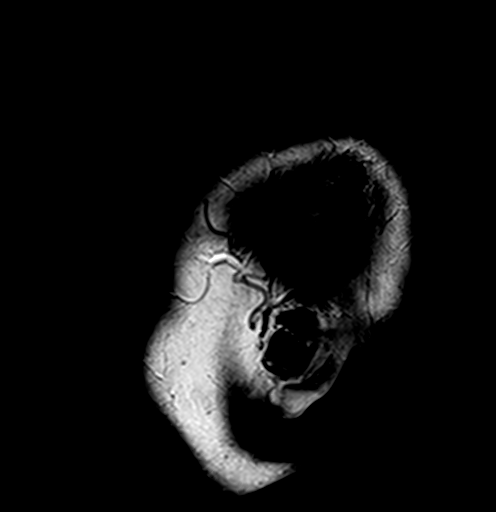

[48 of 48 positions shown; findings below may reference images not displayed]

FINDINGS: BRAIN: There is no acute infarct, acute hemorrhage or extra-axial
collection. Multifocal white matter hyperintensity, most commonly
due to chronic ischemic microangiopathy. The cerebral and cerebellar
volume are age-appropriate. There is no hydrocephalus. The midline
structures are normal.

VASCULAR: Susceptibility-sensitive sequences show no chronic
microhemorrhage or superficial siderosis. The major intracranial
arterial and venous sinus flow voids are normal.

SKULL AND UPPER CERVICAL SPINE: Calvarial bone marrow signal is
normal. There is no skull base mass. The visualized upper cervical
spine and soft tissues are normal.

SINUSES/ORBITS: There are no fluid levels or advanced mucosal
thickening. The mastoid air cells and middle ear cavities are free
of fluid. The orbits are normal.
IMPRESSION: Mild chronic small vessel disease without acute intracranial
abnormality.

## 2019-04-22 ENCOUNTER — Encounter: Payer: Self-pay | Admitting: Neurology

## 2019-04-22 ENCOUNTER — Ambulatory Visit (INDEPENDENT_AMBULATORY_CARE_PROVIDER_SITE_OTHER): Payer: 59 | Admitting: Neurology

## 2019-04-22 ENCOUNTER — Other Ambulatory Visit: Payer: Self-pay

## 2019-04-22 VITALS — BP 115/70 | HR 67 | Temp 98.0°F | Ht 70.0 in | Wt 209.5 lb

## 2019-04-22 DIAGNOSIS — R4189 Other symptoms and signs involving cognitive functions and awareness: Secondary | ICD-10-CM | POA: Diagnosis not present

## 2019-04-22 DIAGNOSIS — G319 Degenerative disease of nervous system, unspecified: Secondary | ICD-10-CM | POA: Diagnosis not present

## 2019-04-22 DIAGNOSIS — R413 Other amnesia: Secondary | ICD-10-CM | POA: Diagnosis not present

## 2019-04-22 DIAGNOSIS — G4733 Obstructive sleep apnea (adult) (pediatric): Secondary | ICD-10-CM | POA: Insufficient documentation

## 2019-04-22 NOTE — Patient Instructions (Signed)
We discussed that the MRI shows atrophy and you scored poorly for age on the [facility] Cognitive Assessment (MoCA score 20/30).  I am concerned about the possibility of a neurodegenerative process related to Alzheimer's.

## 2019-04-22 NOTE — Progress Notes (Signed)
[facility]  PATIENT: Chad Tran DOB: [birth year]  REFERRING DOCTOR OR PCP:  Kathryne Eriksson, MD; Clyde Lundborg SOURCE: Patient, notes from primary care, imaging and laboratory reports, MRI of the brain personally reviewed.  _________________________________   HISTORICAL  CHIEF COMPLAINT:  Chief Complaint  Patient presents with  . New Patient (Initial Visit)    RM 58 with wife (temp 76) Paper referral from  Clyde Lundborg, PA-C for transient global amnesia.   Marland Kitchen MOCA    20/30. MRI brain done 03/28/19 (in epic). No sig. abnormalities. Saw cardiologist. Has heart monitor x30days. Last day: this Friday. Has sleep study this Sunday at [place] at [facility]. Has f/u with cardiologist 05/06/19 to go over results.     HISTORY OF PRESENT ILLNESS:  I had the pleasure of seeing your patient, Chad Tran, at [facility] for neurologic consultation regarding his progressive cognitive difficulties.  He is a 58 year old man who has noted issues with processing information.   He has noted planning and executing tasks is more difficult.  He has noted that he has had difficulty telling time on an analog clock.    Symtoms fluctuate but he feels the number of bad days is progressively worsening.  He notes some difficulty with memory but has had more problems with processing the memory.  He is driving but feels he needs to pay much more attention than he used to.  He has made some errors in driving form point to point but has never been completely lost.     He has tinnitus and has often needed to focus more during conversatoin.   This is a little worse.      He has had problems coming up with the right words. He writes for fun and this is much more difficult.    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   He used to be good at math and now has difficulty.   He can't estimate like he used to and is reliant on a a calculator.      He is more quiet  than he used to be but no behavioral.    He denies depression and he has no irritability.     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   He needs to work > 40 hours many weeks.   He needed to get a bookkeeper to help earlier this year to do tasks he was doing.   He has more trouble estimating.      His wife has noted OSA signs at night with snoring, pauses, gasps, worse on his back.  MRI of the brain 03/28/2019 was interpreted as showing mild chronic microvascular ischemic change and was otherwise normal.  However, I personally reviewed the MRI of the brain dated 03/28/2019 and feel that there is very significant atrophy, much more than expected for age especially in the parietal lobes, to lesser extent in the occipital and frontal lobes.  The medial temporal lobes did not show any atrophy.  There is also mild chronic microvascular ischemic change, a little more than expected for age.  I also reviewed recent lab work.  B12 and TSH and other labs are normal.  Vascular risk factors: Hypertension.  Montreal Cognitive Assessment  04/22/2019  Visuospatial/ Executive (0/5) 3  Naming (0/3) 3  Attention: Read list of digits (0/2) 2  Attention: Read list of letters (0/1) 1  Attention: Serial  7 subtraction starting at 100 (0/3) 1  Language: Repeat phrase (0/2) 2  Language : Fluency (0/1) 0  Abstraction (0/2) 2  Delayed Recall (0/5) 0  Orientation (0/6) 6  Total 20  Adjusted Score (based on education) 20    EPWORTH SLEEPINESS SCALE  On a scale of 0 - 3 what is the chance of dozing:  Sitting and Reading:   2 Watching TV:    2 Sitting inactive in a public place: 2 Passenger in car for one hour: 2 Lying down to rest in the afternoon: 1 Sitting and talking to someone: 0 Sitting quietly after lunch:  3 In a car, stopped in traffic:  0  Total (out of 24): 12/24 (mild excessive sleepiness)    REVIEW OF SYSTEMS: Constitutional: No fevers, chills, sweats, or change in  appetite Eyes: No visual changes, double vision, eye pain Ear, nose and throat: No hearing loss, ear pain, nasal congestion, sore throat Cardiovascular: No chest pain, palpitations Respiratory: No shortness of breath at rest or with exertion.   No wheezes.  His wife notes severe snoring and pauses in his breathing at night. GastrointestinaI: No nausea, vomiting, diarrhea, abdominal pain, fecal incontinence Genitourinary: No dysuria, urinary retention or frequency.  No nocturia. Musculoskeletal: No neck pain, back pain Integumentary: No rash, pruritus, skin lesions Neurological: as above Psychiatric: No depression at this time.  No anxiety Endocrine: No palpitations, diaphoresis, change in appetite, change in weigh or increased thirst Hematologic/Lymphatic: No anemia, purpura, petechiae. Allergic/Immunologic: No itchy/runny eyes, nasal congestion, recent allergic reactions, rashes  ALLERGIES: No Known Allergies  HOME MEDICATIONS:  Current Outpatient Medications:  .  ALPRAZolam (XANAX) 0.25 MG tablet, Take 1 tablet by mouth 3 (three) times daily., Disp: , Rfl:  .  Cyanocobalamin (B-12 PO), Take 1 tablet by mouth daily., Disp: , Rfl:  .  loteprednol (LOTEMAX) 0.5 % ophthalmic suspension, Place 1 drop into the left eye daily., Disp: , Rfl:  .  ramipril (ALTACE) 5 MG capsule, Take 5 mg by mouth daily., Disp: , Rfl:  .  tadalafil (CIALIS) 20 MG tablet, Take 10 mg by mouth as needed., Disp: , Rfl:  .  Multiple Vitamins-Minerals (ZINC PO), Take 1 Dose by mouth daily., Disp: , Rfl:  .  Omega-3 1000 MG CAPS, Take by mouth., Disp: , Rfl:  .  Red Yeast Rice Extract (RED YEAST RICE PO), Take by mouth., Disp: , Rfl:   PAST MEDICAL HISTORY: Past Medical History:  Diagnosis Date  . Anxiety   . BPH (benign prostatic hyperplasia)   . Decreased libido   . Difficulty concentrating   . Erectile dysfunction   . GERD (gastroesophageal reflux disease)   . Hyperlipidemia   . Hypertension   .  Lipoma of back   . Senile nuclear sclerosis     PAST SURGICAL HISTORY: Past Surgical History:  Procedure Laterality Date  . arm surgery Left   . COLONOSCOPY    . CORNEAL TRANSPLANT Bilateral   . CYST REMOVAL HAND     chest and back as well  . HERNIA REPAIR Bilateral     FAMILY HISTORY: Family History  Problem Relation Age of Onset  . Lung cancer Father     SOCIAL HISTORY:  Social History   Socioeconomic History  . Marital status: Married    Spouse name: Not on file  . Number of children: Not on file  . Years of education: Not on file  . Highest education level: Not on file  Occupational History  .  Not on file  Social Needs  . Financial resource strain: Not on file  . Food insecurity    Worry: Not on file    Inability: Not on file  . Transportation needs    Medical: Not on file    Non-medical: Not on file  Tobacco Use  . Smoking status: Former Research scientist (life sciences)  . Smokeless tobacco: Never Used  Substance and Sexual Activity  . Alcohol use: Yes    Comment: weekends  . Drug use: Never  . Sexual activity: Not on file  Lifestyle  . Physical activity    Days per week: Not on file    Minutes per session: Not on file  . Stress: Not on file  Relationships  . Social Herbalist on phone: Not on file    Gets together: Not on file    Attends religious service: Not on file    Active member of club or organization: Not on file    Attends meetings of clubs or organizations: Not on file    Relationship status: Not on file  . Intimate partner violence    Fear of current or ex partner: Not on file    Emotionally abused: Not on file    Physically abused: Not on file    Forced sexual activity: Not on file  Other Topics Concern  . Not on file  Social History Narrative   Lives with wife   Caffeine use: 2 cups coffee per day, tea at night   Right handed      PHYSICAL EXAM  Vitals:   04/22/19 1518  BP: 115/70  Pulse: 67  Temp: 98 F (36.7 C)  Weight: 209 lb 8  oz (95 kg)  Height: _0  (1.778 m)    Body mass index is 30.06 kg/m.   General: The patient is well-developed and well-nourished and in no acute distress  HEENT:  Head is [place]/AT.  Sclera are anicteric.  Funduscopic exam shows normal optic discs and retinal vessels.  Neck: No carotid bruits are noted.  The neck is nontender.  Cardiovascular: The heart has a regular rate and rhythm with a normal S1 and S2. There were no murmurs, gallops or rubs.    Skin: Extremities are without rash or  edema.  Musculoskeletal:  Back is nontender  Neurologic Exam  Mental status: The patient is alert and oriented x 3 at the time of the examination. The patient has poor recent memory with better preserved remote memory.  He had reduced focus (100- 93-?).  He had some errors with executive function.  The Moca score was 20/30.   Speech was fluent and grammar was he did have occasional problems coming up with the right words.  He was able to name items.  There was no left right dissociation.  There was no finger agnosia.  Cranial nerves: Extraocular movements are full.  The right pupil is postoperative and he has reduced right vision. Facial symmetry is present. There is good facial sensation to soft touch bilaterally.Facial strength is normal.  Trapezius and sternocleidomastoid strength is normal. No dysarthria is noted.  The tongue is midline, and the patient has symmetric elevation of the soft palate. No obvious hearing deficits are noted.  Motor:  Muscle bulk is normal.   Tone is normal. Strength is  5 / 5 in all 4 extremities.   Sensory: Sensory testing is intact to pinprick, soft touch and vibration sensation in all 4 extremities.  Coordination: Cerebellar testing reveals  good finger-nose-finger and heel-to-shin bilaterally.  Gait and station: Station is normal.   Gait is normal. Tandem gait is mildly wide. Romberg is negative.   Reflexes: Deep tendon reflexes are symmetric and normal bilaterally.    Plantar responses are flexor.    DIAGNOSTIC DATA (LABS, IMAGING, TESTING) - I reviewed patient records, labs, notes, testing and imaging myself where available.  Lab Results  Component Value Date   WBC 7.5 03/25/2008   HGB 15.7 03/25/2008   HCT 47.3 03/25/2008   MCV 95.4 03/25/2008   PLT 252 03/25/2008      Component Value Date/Time   NA 138 03/25/2008 0913   K 5.1 MARKED HEMOLYSIS 03/25/2008 0913   CL 107 03/25/2008 0913   CO2 24 03/25/2008 0913   GLUCOSE 92 03/25/2008 0913   BUN 18 03/25/2008 0913   CREATININE 0.70 03/25/2008 0913   CALCIUM 9.9 03/25/2008 0913   GFRNONAA >60 03/25/2008 0913   GFRAA  03/25/2008 0913    >60        The eGFR has been calculated using the MDRD equation. This calculation has not been validated in all clinical       ASSESSMENT AND PLAN    1. Brain atrophy ([place])   2. Memory loss   3. Difficulty processing information   4. OSA (obstructive sleep apnea)     In summary, Mr. Morgan is a 58 year old man with progressive cognitive decline over the past couple of years.  I discussed with him and his wife that the 36 of time, at age 24, there are reversible causes to memory loss including sleep apnea and depression.  However, I am very concerned that he has significant atrophy especially in the parietal lobes.  Some patients with Alzheimer's disease will have atrophy predominantly in the parietal lobes.  Additionally younger individuals may have a variant of Alzheimer's called posterior cortical atrophy.  This often affects visual and other processing more than memory.  To better characterize his disorder, we need to get formal neurocognitive testing.  In the interim, he will get a sleep study which is already scheduled.  Based on the results, we may need additional information such as a PET scan.  Based on these results would consider standard treatment or drug trial.  He will return to see me in 3 months but we will let him know the results  of the neurocognitive test earlier if they are available.  Thank you for asking me to see Mr. Lantry.  Please let me know if I can be of further assistance with him or other patients in the future.    Richard A. Felecia Shelling, MD, PhD, FAAN Certified in Neurology, Clinical Neurophysiology, Sleep Medicine, Pain Medicine and Neuroimaging Director, [place] [facility] [address], [place], [place] [address] [phone]

## 2019-07-23 ENCOUNTER — Encounter: Payer: Self-pay | Admitting: Neurology

## 2019-07-23 ENCOUNTER — Ambulatory Visit (INDEPENDENT_AMBULATORY_CARE_PROVIDER_SITE_OTHER): Payer: 59 | Admitting: Neurology

## 2019-07-23 ENCOUNTER — Other Ambulatory Visit: Payer: Self-pay

## 2019-07-23 VITALS — BP 140/82 | HR 51 | Temp 96.9°F | Ht 70.0 in | Wt 220.0 lb

## 2019-07-23 DIAGNOSIS — G319 Degenerative disease of nervous system, unspecified: Secondary | ICD-10-CM

## 2019-07-23 DIAGNOSIS — G4733 Obstructive sleep apnea (adult) (pediatric): Secondary | ICD-10-CM

## 2019-07-23 DIAGNOSIS — R4189 Other symptoms and signs involving cognitive functions and awareness: Secondary | ICD-10-CM

## 2019-07-23 DIAGNOSIS — R413 Other amnesia: Secondary | ICD-10-CM | POA: Diagnosis not present

## 2019-07-23 MED ORDER — AMPHETAMINE-DEXTROAMPHET ER 15 MG PO CP24: 15.0000 mg | ORAL_CAPSULE | ORAL | 0 refills | Status: DC

## 2019-07-23 NOTE — Progress Notes (Signed)
[facility]  PATIENT: Chad Tran DOB: April 26, 1961  REFERRING DOCTOR OR PCP:  Kathryne Eriksson, MD; Clyde Lundborg SOURCE: Patient, notes from primary care, imaging and laboratory reports, MRI of the brain personally reviewed.  _________________________________   HISTORICAL  CHIEF COMPLAINT:  Chief Complaint  Patient presents with  . Follow-up    Brain atrophy room 13 with wife Chad Tran at her temp is 97.5   Update 07/23/2019: He was recently diagnosed with severe OSA (AHI = 39.7)  and is using BiPAP nightly.  He was titrated to BiPAP 19/15.     He feels less sleepy.   However, he does not feel it has helped the cognition much.    He is noting issues reduced focus/attention, telling time on analog clock.  He feels he does worse by the end of the day.    He tried a friend's ADD medication and it did help some.   He does not know the name of the medication but thinks it was Adderall.  He is scheduled to have neurocognitive evaluation in early January.  HISTORY OF PRESENT ILLNESS:  I had the pleasure of seeing your patient, Chad Tran, at [facility] for neurologic consultation regarding his progressive cognitive difficulties.  He is a 58 year old man who has noted issues with processing information.   He has noted planning and executing tasks is more difficult.  He has noted that he has had difficulty telling time on an analog clock.    Symtoms fluctuate but he feels the number of bad days is progressively worsening.  He notes some difficulty with memory but has had more problems with processing the memory.  He is driving but feels he needs to pay much more attention than he used to.  He has made some errors in driving form point to point but has never been completely lost.     He has tinnitus and has often needed to focus more during conversatoin.   This is a little worse.      He has had problems coming up with the right words. He writes for fun  and this is much more difficult.    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   He used to be good at math and now has difficulty.   He can't estimate like he used to and is reliant on a a calculator.      He is more quiet than he used to be but no behavioral.    He denies depression and he has no irritability.     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   He needs to work > 40 hours many weeks.   He needed to get a bookkeeper to help earlier this year to do tasks he was doing.   He has more trouble estimating.      His wife has noted OSA signs at night with snoring, pauses, gasps, worse on his back.  MRI of the brain 03/28/2019 was interpreted as showing mild chronic microvascular ischemic change and was otherwise normal.  However, I personally reviewed the MRI of the brain dated 03/28/2019 and feel that there is very significant atrophy, much more than expected for age especially in the parietal lobes, to lesser extent in the occipital and frontal lobes.  The medial temporal lobes did not show any atrophy.  There is also mild chronic microvascular ischemic change, a little more than  expected for age.  I also reviewed recent lab work.  B12 and TSH and other labs are normal.  Vascular risk factors: Hypertension.  Montreal Cognitive Assessment  04/22/2019  Visuospatial/ Executive (0/5) 3  Naming (0/3) 3  Attention: Read list of digits (0/2) 2  Attention: Read list of letters (0/1) 1  Attention: Serial 7 subtraction starting at 100 (0/3) 1  Language: Repeat phrase (0/2) 2  Language : Fluency (0/1) 0  Abstraction (0/2) 2  Delayed Recall (0/5) 0  Orientation (0/6) 6  Total 20  Adjusted Score (based on education) 20    EPWORTH SLEEPINESS SCALE  On a scale of 0 - 3 what is the chance of dozing:  Sitting and Reading:   2 Watching TV:    2 Sitting inactive in a public place: 2 Passenger in car for one hour: 2 Lying down to rest in the afternoon: 1  Sitting and talking to someone: 0 Sitting quietly after lunch:  3 In a car, stopped in traffic:  0  Total (out of 24): 12/24 (mild excessive sleepiness)    REVIEW OF SYSTEMS: Constitutional: No fevers, chills, sweats, or change in appetite Eyes: No visual changes, double vision, eye pain Ear, nose and throat: No hearing loss, ear pain, nasal congestion, sore throat Cardiovascular: No chest pain, palpitations Respiratory: No shortness of breath at rest or with exertion.   No wheezes.  His wife notes severe snoring and pauses in his breathing at night. GastrointestinaI: No nausea, vomiting, diarrhea, abdominal pain, fecal incontinence Genitourinary: No dysuria, urinary retention or frequency.  No nocturia. Musculoskeletal: No neck pain, back pain Integumentary: No rash, pruritus, skin lesions Neurological: as above Psychiatric: No depression at this time.  No anxiety Endocrine: No palpitations, diaphoresis, change in appetite, change in weigh or increased thirst Hematologic/Lymphatic: No anemia, purpura, petechiae. Allergic/Immunologic: No itchy/runny eyes, nasal congestion, recent allergic reactions, rashes  ALLERGIES: No Known Allergies  HOME MEDICATIONS:  Current Outpatient Medications:  .  ALPRAZolam (XANAX) 0.25 MG tablet, Take 1 tablet by mouth 3 (three) times daily., Disp: , Rfl:  .  Cholecalciferol (VITAMIN D-3 PO), Take 1,000 Units by mouth., Disp: , Rfl:  .  Cyanocobalamin (B-12 PO), Take 1 tablet by mouth daily., Disp: , Rfl:  .  loteprednol (LOTEMAX) 0.5 % ophthalmic suspension, Place 1 drop into the left eye daily., Disp: , Rfl:  .  Multiple Vitamin tablet, Take by mouth., Disp: , Rfl:  .  ramipril (ALTACE) 5 MG capsule, Take 5 mg by mouth daily., Disp: , Rfl:  .  tadalafil (CIALIS) 20 MG tablet, Take 10 mg by mouth as needed., Disp: , Rfl:  .  amphetamine-dextroamphetamine (ADDERALL XR) 15 MG 24 hr capsule, Take 1 capsule by mouth every morning., Disp: 30  capsule, Rfl: 0  PAST MEDICAL HISTORY: Past Medical History:  Diagnosis Date  . Anxiety   . BPH (benign prostatic hyperplasia)   . Decreased libido   . Difficulty concentrating   . Erectile dysfunction   . GERD (gastroesophageal reflux disease)   . Hyperlipidemia   . Hypertension   . Lipoma of back   . Senile nuclear sclerosis     PAST SURGICAL HISTORY: Past Surgical History:  Procedure Laterality Date  . arm surgery Left   . COLONOSCOPY    . CORNEAL TRANSPLANT Bilateral   . CYST REMOVAL HAND     chest and back as well  . HERNIA REPAIR Bilateral     FAMILY HISTORY: Family History  Problem Relation   Age of Onset  . Lung cancer Father     SOCIAL HISTORY:  Social History   Socioeconomic History  . Marital status: Married    Spouse name: Not on file  . Number of children: Not on file  . Years of education: Not on file  . Highest education level: Not on file  Occupational History  . Not on file  Social Needs  . Financial resource strain: Not on file  . Food insecurity    Worry: Not on file    Inability: Not on file  . Transportation needs    Medical: Not on file    Non-medical: Not on file  Tobacco Use  . Smoking status: Former Research scientist (life sciences)  . Smokeless tobacco: Never Used  Substance and Sexual Activity  . Alcohol use: Yes    Comment: weekends  . Drug use: Never  . Sexual activity: Not on file  Lifestyle  . Physical activity    Days per week: Not on file    Minutes per session: Not on file  . Stress: Not on file  Relationships  . Social Herbalist on phone: Not on file    Gets together: Not on file    Attends religious service: Not on file    Active member of club or organization: Not on file    Attends meetings of clubs or organizations: Not on file    Relationship status: Not on file  . Intimate partner violence    Fear of current or ex partner: Not on file    Emotionally abused: Not on file    Physically abused: Not on file    Forced  sexual activity: Not on file  Other Topics Concern  . Not on file  Social History Narrative   Lives with wife   Caffeine use: 2 cups coffee per day, tea at night   Right handed      PHYSICAL EXAM  Vitals:   07/23/19 1554  BP: 140/82  Pulse: (!) 51  Temp: (!) 96.9 F (36.1 C)  Weight: 220 lb (99.8 kg)  Height: 5' 10" (1.778 m)    Body mass index is 31.57 kg/m.   General: The patient is well-developed and well-nourished and in no acute distress  HEENT:  Head is [place]/AT.  Sclera are anicteric.  Funduscopic exam shows normal optic discs and retinal vessels.  Neck: No carotid bruits are noted.  The neck is nontender.  Cardiovascular: The heart has a regular rate and rhythm with a normal S1 and S2. There were no murmurs, gallops or rubs.    Skin: Extremities are without rash or  edema.  Musculoskeletal:  Back is nontender  Neurologic Exam  Mental status: The patient is alert and oriented x 3 at the time of the examination. The patient has poor recent memory with better preserved remote memory.  He had reduced focus (100- 93-?).     Speech was fluent and grammar was he did have occasional problems coming up with the right words.      Cranial nerves: Extraocular movements are full.  The right pupil is postoperative and he has reduced right vision. Facial symmetry is present. There is good facial sensation to soft touch bilaterally.Facial strength is normal.  Trapezius and sternocleidomastoid strength is normal. No dysarthria is noted.  The tongue is midline, and the patient has symmetric elevation of the soft palate. No obvious hearing deficits are noted.  Motor:  Muscle bulk is normal.   Tone  is normal. Strength is  5 / 5 in all 4 extremities.   Sensory: Sensory testing is intact to pinprick, soft touch and vibration sensation in all 4 extremities.  Coordination: Cerebellar testing reveals good finger-nose-finger and heel-to-shin bilaterally.  Gait and station: Station is  normal.   Gait is normal. Tandem gait is mildly wide. Romberg is negative.   Reflexes: Deep tendon reflexes are symmetric and normal bilaterally.        DIAGNOSTIC DATA (LABS, IMAGING, TESTING) - I reviewed patient records, labs, notes, testing and imaging myself where available.  Lab Results  Component Value Date   WBC 7.5 03/25/2008   HGB 15.7 03/25/2008   HCT 47.3 03/25/2008   MCV 95.4 03/25/2008   PLT 252 03/25/2008      Component Value Date/Time   NA 138 03/25/2008 0913   K 5.1 MARKED HEMOLYSIS 03/25/2008 0913   CL 107 03/25/2008 0913   CO2 24 03/25/2008 0913   GLUCOSE 92 03/25/2008 0913   BUN 18 03/25/2008 0913   CREATININE 0.70 03/25/2008 0913   CALCIUM 9.9 03/25/2008 0913   GFRNONAA >60 03/25/2008 0913   GFRAA  03/25/2008 0913    >60        The eGFR has been calculated using the MDRD equation. This calculation has not been validated in all clinical       ASSESSMENT AND PLAN    1. Brain atrophy ([facility])   2. Memory loss   3. Difficulty processing information   4. OSA (obstructive sleep apnea)      1.   I reviewed the MRI of the brain in the presence of Mr. Macapagal and his wife.  He has atrophy that is predominantly in the parietal lobes and to a lesser extent ilia separatory lobes with very little atrophy in the frontal and temporal lobes.  The extent is definitely more than typical for age.  He also has mild chronic microvascular ischemic change that is less likely to be clinically significant. 2.   Continue CPAP.  We discussed some different masks since he is having trouble with his current mask and he will discuss with Crystal Rose when he sees her tomorrow. 3.   I will send in a prescription for Adderall XR 15 mg.  Consider adding donepezil.  We also discussed that based on the results of the neurocognitive evaluation and due to his younger age, we might want to consider an MRI PET scan to determine if he could have early onset Alzheimer's disease or posterior  cortical atrophy (both are amyloid apathies) 4.   Stay active and exercise as tolerated. 5.. Return in 4 months or sooner for new or worsening neurologic symptoms.  A. , MD, PhD, FAAN Certified in Neurology, Clinical Neurophysiology, Sleep Medicine, Pain Medicine and Neuroimaging Director, [facility]  [facility] [address], [address] [place], [place] [address] [phone]   

## 2019-07-28 ENCOUNTER — Other Ambulatory Visit: Payer: Self-pay

## 2019-07-28 NOTE — Telephone Encounter (Signed)
PA for Adderall 15 mg has been sent via [facility].

## 2019-07-30 MED ORDER — AMPHETAMINE-DEXTROAMPHETAMINE 10 MG PO TABS: ORAL_TABLET | ORAL | 0 refills | Status: DC

## 2019-07-30 NOTE — Addendum Note (Signed)
Addended by: Noberto Retort C on: 07/30/2019 12:21 PM   Modules accepted: Orders

## 2019-07-30 NOTE — Telephone Encounter (Signed)
PA denied by [facility] due to the prescription not being given for a "medically accepted indication".  The [email or web address] cost for generic Adderall XR 15mg , 30 tablets, is $37.59.   Per vo by Dr. Felecia Shelling, he will change him to immediate release, generic Adderall 10mg , take one tablet in am and one tablet at noon.  The [email or web address] cost for #60 tablets is $22.98 at [facility].  I spoke to the patient's wife (on [place]) who is agreeable to this medication change and verbalized understanding of [email or web address].  She would like the new prescription sent to [facility] in [place].  I called [facility] [phone]) and requested the Adderall XR prescription be voided.

## 2019-09-01 ENCOUNTER — Other Ambulatory Visit: Payer: Self-pay | Admitting: Neurology

## 2019-09-01 MED ORDER — AMPHETAMINE-DEXTROAMPHETAMINE 10 MG PO TABS: ORAL_TABLET | ORAL | 0 refills | Status: DC

## 2019-09-01 NOTE — Telephone Encounter (Signed)
Pt is needing a refill on his amphetamine-dextroamphetamine (ADDERALL) 10 MG tablet sent to the [facility] in [place]

## 2019-09-11 ENCOUNTER — Encounter: Payer: Self-pay | Admitting: Psychology

## 2019-09-11 ENCOUNTER — Encounter: Payer: 59 | Attending: Psychology | Admitting: Psychology

## 2019-09-11 ENCOUNTER — Other Ambulatory Visit: Payer: Self-pay

## 2019-09-11 DIAGNOSIS — G319 Degenerative disease of nervous system, unspecified: Secondary | ICD-10-CM

## 2019-09-11 DIAGNOSIS — R413 Other amnesia: Secondary | ICD-10-CM | POA: Diagnosis present

## 2019-09-11 DIAGNOSIS — G4733 Obstructive sleep apnea (adult) (pediatric): Secondary | ICD-10-CM | POA: Insufficient documentation

## 2019-09-11 DIAGNOSIS — R4189 Other symptoms and signs involving cognitive functions and awareness: Secondary | ICD-10-CM | POA: Diagnosis not present
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
## 2019-09-29 ENCOUNTER — Encounter: Payer: Self-pay | Admitting: Psychology

## 2019-09-29 ENCOUNTER — Telehealth: Payer: Self-pay | Admitting: Psychology

## 2019-09-29 ENCOUNTER — Other Ambulatory Visit: Payer: Self-pay

## 2019-09-29 ENCOUNTER — Encounter: Payer: 59 | Admitting: Psychology

## 2019-09-29 DIAGNOSIS — G4733 Obstructive sleep apnea (adult) (pediatric): Secondary | ICD-10-CM

## 2019-09-29 DIAGNOSIS — R413 Other amnesia: Secondary | ICD-10-CM

## 2019-09-29 DIAGNOSIS — R4189 Other symptoms and signs involving cognitive functions and awareness: Secondary | ICD-10-CM

## 2019-09-29 DIAGNOSIS — G319 Degenerative disease of nervous system, unspecified: Secondary | ICD-10-CM

## 2019-09-29 NOTE — Progress Notes (Addendum)
The patient arrived on time to his 8:00 testing appointment, which lasted 300 minutes.   Behavioral Observations:  Appearance: Casually and appropriately dressed with adequate hygiene. Gait: Ambulated independently without assistance. Speech: Clear, normal rate, normal tone & volume. Thought process:  Linear, mostly logical, somewhat disorganized, perseverative, and concrete.   Mood/Affect:  Anxious and depressed, appropriate.   Interpersonal: Polite and appropriate. Orientation: Oriented x 4 Effort/Motivation: Good   He did not appear to have difficulty seeing, hearing, or understanding test items/questions but did require a significant amount of additional prompting throughout the evaluation (e.g. repeated questions/instructions). He exhibited reduced distress tolerance (e.g. became frustrated) on questions he did not know or tasks that were more difficult.   Tests Administered: . [place] (BNT) . Clock Drawing Test . Jarome Lamas Executive Function System (D-KEFS), Select subtests . Wechsler Adult Intelligence Scale, 4th Edition (WAIS-IV) . Wechsler Memory Scale, 4th edition, Adult Battery (WMS-IV-A)  Results:  BNT . Total= 57/60, T=53, 62nd %  Clock Drawing Test   . Below Expectation (e.g. All numbers present but grossly misplaced and disorganized. Minute and hour hands also grossly misplaced in inaccurate; able to construct symmetrical circle but placed numbers outside face)  D-KEFS  . Color Word Interference   o Color Naming  - ss=5, 5%  o Word Reading - ss=10, 50%  o Inhibition  - ss=1, <1%  o Inhibition/Switching - ss=8, 25%  . Design Fluency  o Total Correct - ss=10, 50%  o Filled Dots Total - ss=12, 75% - Repeated Designs=2  o Empty Dots Total - ss=11, 63% - Repeated Designs=5  o Switching Total - ss=6, 9% - Set Loss Errors=1  . Trail Making Test   o Visual Scanning - Time=25", ss=11, 63% - Errors=0  o  Number  Sequencing - Time=48", ss=10, 50% - Errors=0  o Letter Sequencing - Time=70", ss=7, 16% - Errors=0  o Number-Letter Switching - Time=170", ss=6, 9% - Errors=1 (set loss)  o Motor Speed - Time=27", ss=12, 75% - Errors=0  . Verbal Fluency   o Letter Fluency Total  - ss=9, 37%   o Category Fluency Total  - ss=9, 37%   o Category Switching Total  - ss=5, 5 %  o Category Switching Accuracy - ss=6, 9%   WAIS-IV   Composite Score Summary  Scale Sum of Scaled Scores Composite Score Percentile Rank 95% Conf. Interval Qualitative Description  Verbal Comprehension 31 VCI 102 55 96-108 Average  Perceptual Reasoning 26 PRI 92 30 86-99 Average  Working Memory 15 WMI 86 18 80-94 Low Average  Processing Speed 11 PSI 76 5 70-87 Borderline  Full Scale 83 FSIQ 88 21 84-92 Low Average  General Ability 57 GAI 97 42 92-102 Average   Index Level Discrepancy Comparisons  Comparison Score 1 Score 2 Difference Critical Value .05 Significant Difference Y/N Base Rate by Overall Sample  VCI - PRI 102 92 10 8.31 Y 23.5  VCI - WMI 102 86 16 8.82 Y 10.8  VCI - PSI 102 76 26 10.19 Y 5.7  PRI - WMI 92 86 6 9.74 N 32.5  PRI - PSI 92 76 16 11.00 Y 15.0  WMI - PSI 86 76 10 11.38 N 24.5  FSIQ - GAI 88 97 -9 3.51 Y 3.6   Differences Between Subtest and Overall Mean of Subtest Scores  Subtest Subtest Scaled Score Mean Scaled Score Difference Critical Value .05 Strength or Weakness Base Rate  Block Design 7 8.30 -1.30 2.85  >25%  Similarities 10 8.30 1.70 2.82  >25%  Digit Span 10 8.30 1.70 2.22  >25%  Matrix Reasoning 6 8.30 -2.30 2.54  >25%  Vocabulary 11 8.30 2.70 2.03 S 15%  Arithmetic 5 8.30 -3.30 2.73 W 5-10%  Symbol Search 6 8.30 -2.30 3.42  >25%  Visual Puzzles 13 8.30 4.70 2.71 S 2-5%  Information 10 8.30 1.70 2.19  >25%  Coding 5 8.30 -3.30 2.97 W 15%    WMS-IV (Adult Battery)   Index Score Summary  Index Sum of Scaled Scores Index Score Percentile Rank 95%  Confidence Interval Qualitative Descriptor  Auditory Memory (AMI) 18 67 1 62-75 Extremely Low  Visual Memory (VMI) 33 89 23 84-95 Low Average  Visual Working Memory (VWMI) 12 77 6 71-86 Borderline  Immediate Memory (IMI) 25 75 5 70-82 Borderline  Delayed Memory (DMI) 26 76 5 71-84 Borderline   Primary Subtest Scaled Score Summary  Subtest Domain Raw Score Scaled Score Percentile Rank  Logical Memory I AM 15 5 5   Logical Memory II AM 11 6 9   Verbal [employer] I AM 10 4 2   Verbal [employer] II AM 2 3 1   Designs I VM 49 6 9  Designs II VM 39 7 16  Visual Reproduction I VM 34 10 50  Visual Reproduction II VM 21 10 50  Spatial Addition VWM 7 7 16   Symbol Span VWM 10 5 5    PROCESS SCORE CONVERSIONS  Auditory Memory Process Score Summary  Process Score Raw Score Scaled Score Percentile Rank Cumulative Percentage (Base Rate)  LM II Recognition 23 - - 26-50%  VPA II Recognition 34 - - 10-16%   Visual Memory Process Score Summary  Process Score Raw Score Scaled Score Percentile Rank Cumulative Percentage (Base Rate)  DE I Content 32 9 37 -  DE I Spatial 7 3 1  -  DE II Content 31 9 37 -  DE II Spatial 8 7 16  -  DE II Recognition 13 - - 26-50%  VR II Recognition 7 - - >75%   ABILITY-MEMORY ANALYSIS  Ability Score:  GAI: 97 Date of Testing:  WAIS-IV; WMS-IV 2019/09/29  Predicted Difference Method   Index Predicted WMS-IV Index Score Actual WMS-IV Index Score Difference Critical Value  Significant Difference Y/N Base Rate  Auditory Memory 98 67 31 8.95 Y <1%  Visual Memory 98 89 9 8.82 Y 25%  Visual Working Memory 98 77 21 11.24 Y 3%  Immediate Memory 98 75 23 10.35 Y 2%  Delayed Memory 98 76 22 10.08 Y 4%  Statistical significance (critical value) at the .01 level.   Contrast Scaled Scores  Score Score 1 Score 2 Contrast Scaled Score  General Ability Index vs. Auditory Memory Index 97 67 3  General Ability Index vs. Visual Memory Index 97 89 8   General Ability Index vs. Visual Working Memory Index 97 77 5  General Ability Index vs. Immediate Memory Index 97 75 3  General Ability Index vs. Delayed Memory Index 97 76 5  Verbal Comprehension Index vs. Auditory Memory Index 102 67 2  Perceptual Reasoning Index vs. Visual Memory Index 92 89 9  Perceptual Reasoning Index vs. Visual Working Memory Index 92 77 5  Working Memory Index vs. Auditory Memory Index 86 67 4  Working Memory Index vs. Visual Working Memory Index 86 77 6

## 2019-10-13 ENCOUNTER — Other Ambulatory Visit: Payer: Self-pay

## 2019-10-13 MED ORDER — AMPHETAMINE-DEXTROAMPHETAMINE 10 MG PO TABS: ORAL_TABLET | ORAL | 0 refills | Status: DC

## 2019-10-13 NOTE — Telephone Encounter (Signed)
1) Medication(s) Requested (by name): amphetamine-dextroamphetamine (ADDERALL) 10 MG tablet  2) Pharmacy of Choice: [facility], [address]  [address], [place] [address]

## 2019-10-23 ENCOUNTER — Encounter: Payer: Self-pay | Admitting: Psychology

## 2019-10-23 NOTE — Telephone Encounter (Signed)
Attempted to call patient and discuss preliminary results as requested. Patient did not answer and had a full answering machine; not able to leave message. Will continue to try and reach patient.

## 2019-11-13 ENCOUNTER — Other Ambulatory Visit: Payer: Self-pay | Admitting: Neurology

## 2019-11-13 MED ORDER — AMPHETAMINE-DEXTROAMPHETAMINE 10 MG PO TABS: ORAL_TABLET | ORAL | 0 refills | Status: DC

## 2019-11-13 NOTE — Telephone Encounter (Signed)
Pt is needing a refill on his amphetamine-dextroamphetamine (ADDERALL) 10 MG tablet sent in to the [facility] on [address] In [place]

## 2019-11-20 ENCOUNTER — Ambulatory Visit: Payer: PRIVATE HEALTH INSURANCE | Admitting: Family Medicine

## 2019-11-27 ENCOUNTER — Encounter: Payer: Self-pay | Admitting: Psychology

## 2019-11-27 ENCOUNTER — Encounter: Payer: 59 | Attending: Psychology | Admitting: Psychology

## 2019-11-27 ENCOUNTER — Other Ambulatory Visit: Payer: Self-pay

## 2019-11-27 DIAGNOSIS — G319 Degenerative disease of nervous system, unspecified: Secondary | ICD-10-CM

## 2019-11-27 DIAGNOSIS — R4189 Other symptoms and signs involving cognitive functions and awareness: Secondary | ICD-10-CM | POA: Diagnosis present

## 2019-11-27 DIAGNOSIS — R413 Other amnesia: Secondary | ICD-10-CM

## 2019-11-27 DIAGNOSIS — G4733 Obstructive sleep apnea (adult) (pediatric): Secondary | ICD-10-CM | POA: Insufficient documentation

## 2019-11-27 DIAGNOSIS — G300 Alzheimer's disease with early onset: Secondary | ICD-10-CM

## 2019-11-27 DIAGNOSIS — F0280 Dementia in other diseases classified elsewhere without behavioral disturbance: Secondary | ICD-10-CM

## 2019-11-27 NOTE — Progress Notes (Signed)
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 Neuropsychologist

## 2019-12-11 ENCOUNTER — Encounter: Payer: 59 | Attending: Psychology | Admitting: Psychology

## 2019-12-11 ENCOUNTER — Encounter: Payer: Self-pay | Admitting: Psychology

## 2019-12-11 ENCOUNTER — Other Ambulatory Visit: Payer: Self-pay

## 2019-12-11 DIAGNOSIS — G300 Alzheimer's disease with early onset: Secondary | ICD-10-CM

## 2019-12-11 DIAGNOSIS — F0280 Dementia in other diseases classified elsewhere without behavioral disturbance: Secondary | ICD-10-CM

## 2019-12-11 DIAGNOSIS — G4733 Obstructive sleep apnea (adult) (pediatric): Secondary | ICD-10-CM | POA: Diagnosis present

## 2019-12-11 DIAGNOSIS — G319 Degenerative disease of nervous system, unspecified: Secondary | ICD-10-CM | POA: Diagnosis present

## 2019-12-11 DIAGNOSIS — R4189 Other symptoms and signs involving cognitive functions and awareness: Secondary | ICD-10-CM | POA: Diagnosis present

## 2019-12-11 DIAGNOSIS — R413 Other amnesia: Secondary | ICD-10-CM

## 2019-12-11 NOTE — Progress Notes (Signed)
12/11/2019:  Today's visit was an in person visit that was conducted in my outpatient clinic office with myself and the patient present.  We reviewed the results of the recent neuropsychological evaluation and the summary and interpretations can be found at the bottom of this note from that evaluation.  The complete neuropsychological evaluation can be found on his electronic medical records dated 11/27/2019 with the complete history and review of symptoms in his medical records dated 09/11/2019.  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  However, we will need to do repeat testing in approximately 9 months to gain more confidence as to this diagnostic consideration.  While the patient has a family history of Parkinson's and has shown some motor symptoms related to pill-rolling like motor symptoms no other tremors are noted.    Summary of Results:                        The results of the current objective assessment of a wide-ranging neuropsychological assessment of current cognitive functioning show that there are reductions in global cognitive performances overall with considerable variability between hold test (measures that are the most resistant to change acutely) and other components of cognitive performances.  The patient showed generally well preserved abilities for verbal reasoning and problem-solving, his vocabulary knowledge and general fund of information.  The patient also showed relatively well preserved abilities for visual estimation and judgment as these components are likely well learned and part of his longstanding occupational skills.  As far as attentional abilities, the patient showed mild difficulties with pure auditory encoding abilities but showed significant decrease in performance when  asked to perform even simple calculations or manipulation of that encoded information.  The patient also showed significant weaknesses for visual encoding abilities.  With regard to language abilities the patient showed good receptive language ability and generally well preserved targeted or cued expressive language components and basic verbal fluency abilities.  However, the patient displayed significant impairments for verbal flexibility and dynamic fluency which would likely have significant impact on conversational speech.  The patient shows significant weaknesses in deficits with regard to visual analysis and organization, visual reasoning and problem solving, visual scanning, visual searching and overall speed of mental operations, reductions in cognitive fluency and cognitive shifting abilities.  The patient shows significant memory deficits both auditorily and visually with auditory memory and learning being more significantly impaired than visual memory and learning.  There were deficits with regard to both initial encoding of information to a certain degree but also significant difficulties with storage, organization of information.  His performance did improve some with cued recall and recognition formats suggesting that some degree of his memory functions have to do with executive functioning abilities impacting his ability to retrieve information that was initially learned but retrieval of that information was hampered.  Impression/Diagnosis:                     Overall, the results of the current neuropsychological evaluation utilizing data sets including subjective symptoms, current medical/neurological information as well as objective neuropsychological testing do show great consistency between the patient's subjective reports of difficulties with memory and learning and forgetting conversations with others, difficulties with attention and concentration, geographic disorientation and  visual-spatial deficits, difficulties maintaining and performing at work for  even basic mathematical problems that would have been quite easy prior, issues with problem-solving and reasoning abilities, and difficulty retrieving historical information and memories.  While the patient also has a history of prior head injury with significant loss of consciousness in 1979 he did quite well for all these years afterwards and likely returned to baseline with little long-term residual effects of this concussive event.  Current medical information includes a family history of Parkinson's disease with both his father and brother and the patient currently showing some pill-rolling motor symptoms but no other tremors noted.  The patient denies any visual hallucinations.  He is being treated for significant obstructive sleep apnea with a BiPAP machine.  The patient also has had recent MRI studies that showed mild chronic microvascular ischemic changes as well as very significant atrophy especially in the parietal lobes and to a lesser extent occipital and frontal lobes.  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  While there is a very strong family history of diagnosis of Parkinson's disease and he does have some indication of pill-rolling behavior there are no other tremors noted.  The degree of microvascular ischemic changes would not explain the level of cognitive deficits both objectively identified or subjectively reported to attribute these changes to cerebrovascular issues and he has no indication of previous strokes.  The patient's objective neuropsychological testing would also suggest involvement of parietal lobe temporal lobe and frontal lobe changes.  The patient describes a progressive decline over the past year with significant loss of functioning over this time.  While sleep apneas can produce significant memory and cognitive  impacts the subjective reports and objective assessments would go beyond that typically seen with simple untreated sleep apneas.  The patient is also been diagnosed and treated for sleep apneas and is now consistently using a BiPAP machine with cognitive functioning only slightly improving as a result.  As this is the first objective assessment it will be important to reassess the patient in approximately 9 months to establish if there is further indications of objective findings of progressive decline.  We will set the patient up for follow-up repeat testing in approximately 9 months to complete this process for more definitive diagnostic consideration.  I will provide feedback to the patient regarding the results of the current neuropsychological evaluation and we will review specific recommendations going forward.  I will also make sure that this report is available in his electronic medical records for his treating neurologist to have available.  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 Neuropsychologist

## 2019-12-12 ENCOUNTER — Other Ambulatory Visit: Payer: Self-pay | Admitting: Neurology

## 2019-12-12 NOTE — Telephone Encounter (Signed)
Pt's wife has called for a refill on  amphetamine-dextroamphetamine (ADDERALL) 10 MG tablet  [facility] [employer] [place] [facility]

## 2019-12-14 MED ORDER — AMPHETAMINE-DEXTROAMPHETAMINE 10 MG PO TABS: ORAL_TABLET | ORAL | 0 refills | Status: DC

## 2019-12-14 NOTE — Addendum Note (Signed)
Addended by: Wyvonnia Lora on: 12/14/2019 08:01 PM   Modules accepted: Orders

## 2019-12-17 ENCOUNTER — Ambulatory Visit (INDEPENDENT_AMBULATORY_CARE_PROVIDER_SITE_OTHER): Payer: 59 | Admitting: Family Medicine

## 2019-12-17 ENCOUNTER — Other Ambulatory Visit: Payer: Self-pay

## 2019-12-17 ENCOUNTER — Encounter: Payer: Self-pay | Admitting: Family Medicine

## 2019-12-17 VITALS — BP 138/88 | HR 88 | Temp 97.7°F | Ht 70.0 in | Wt 220.0 lb

## 2019-12-17 DIAGNOSIS — G319 Degenerative disease of nervous system, unspecified: Secondary | ICD-10-CM

## 2019-12-17 DIAGNOSIS — R4189 Other symptoms and signs involving cognitive functions and awareness: Secondary | ICD-10-CM

## 2019-12-17 DIAGNOSIS — R413 Other amnesia: Secondary | ICD-10-CM

## 2019-12-17 DIAGNOSIS — G4733 Obstructive sleep apnea (adult) (pediatric): Secondary | ICD-10-CM

## 2019-12-17 NOTE — Progress Notes (Signed)
PATIENT: Chad Tran DOB: Oct 09, 1960  REASON FOR VISIT: follow up HISTORY FROM: patient  Chief Complaint  Patient presents with  . Follow-up    Brain atrophy, rm 2, with wife     HISTORY OF PRESENT ILLNESS: Today 12/17/19 Chad Tran is a 59 y.o. male here today for follow up for subjective memory complaints and inattention. He was started on Adderall XR 63m in 07/2019. Insurance would not cover medication so dosing was switched to IR 119mtwice daily. He usually only takes one tablet. Sometimes he will take the second dose. He does feel that this medication helps to keep him focused. He feels more alert. Math has been easier for him. 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 Weight is stable. No denies cardiac side effects. He is using CPAP nightly.   HISTORY: (copied from Dr SaGarth Bignessote on 07/23/2019)  He was recently diagnosed with severe OSA (AHI = 39.7)  and is using BiPAP nightly.  He was titrated to BiPAP 19/15.     He feels less sleepy.   However, he does not feel it has helped the cognition much.    He is noting issues reduced focus/attention, telling time on analog clock.  He feels he does worse by the end of the day.    He tried a friend's ADD medication and it did help some.   He does not know the name of the medication but thinks it was Adderall.  He is scheduled to have neurocognitive evaluation in early January.  HISTORY OF PRESENT ILLNESS:  I had the pleasure of seeing your patient, Chad Tran [facility] for neurologic consultation regarding his progressive cognitive difficulties.  He is a [age]ear old man who has noted issues with processing information.   He has noted planning and executing tasks is more difficult.  He has noted that he has had difficulty telling time on an analog clock.    Symtoms fluctuate but he feels the number of bad days is progressively worsening.  He notes some difficulty with memory  but has had more problems with processing the memory.  He is driving but feels he needs to pay much more attention than he used to.  He has made some errors in driving form point to point but has never been completely lost.     He has tinnitus and has often needed to focus more during conversatoin.   This is a little worse.      He has had problems coming up with the right words. He writes for fun and this is much more difficult.    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   He used to be good at math and now has difficulty.   He can't estimate like he used to and is reliant on a a calculator.      He is more quiet than he used to be but no behavioral.    He denies depression and he has no irritability.     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   He needs to work > 40 hours many weeks.   He needed to get a bookkeeper to help earlier this year to do tasks he was doing.   He has more trouble estimating.      His wife has noted OSA signs at night with snoring, pauses, gasps, worse on his back.  MRI of the brain 03/28/2019 was interpreted as showing mild chronic microvascular ischemic change and was otherwise normal.  However, I personally reviewed the MRI of the brain dated 03/28/2019 and feel that there is very significant atrophy, much more than expected for age especially in the parietal lobes, to lesser extent in the occipital and frontal lobes.  The medial temporal lobes did not show any atrophy.  There is also mild chronic microvascular ischemic change, a little more than expected for age.  I also reviewed recent lab work.  B12 and TSH and other labs are normal.  Vascular risk factors: Hypertension.  Montreal Cognitive Assessment  04/22/2019  Visuospatial/ Executive (0/5) 3  Naming (0/3) 3  Attention: Read list of digits (0/2) 2  Attention: Read list of letters (0/1) 1  Attention: Serial 7 subtraction starting at 100 (0/3) 1  Language:  Repeat phrase (0/2) 2  Language : Fluency (0/1) 0  Abstraction (0/2) 2  Delayed Recall (0/5) 0  Orientation (0/6) 6  Total 20  Adjusted Score (based on education) 20    EPWORTH SLEEPINESS SCALE  On a scale of 0 - 3 what is the chance of dozing:  Sitting and Reading:                           2 Watching TV:                                      2 Sitting inactive in a public place:        2 Passenger in car for one hour:           2 Lying down to rest in the afternoon:   1 Sitting and talking to someone:          0 Sitting quietly after lunch:                   3 In a car, stopped in traffic:                  0  Total (out of 24): 12/24 (mild excessive sleepiness)    REVIEW OF SYSTEMS: Out of a complete 14 system review of symptoms, the patient complains only of the following symptoms, memory loss, inattention and all other reviewed systems are negative.  ALLERGIES: No Known Allergies  HOME MEDICATIONS: Outpatient Medications Prior to Visit  Medication Sig Dispense Refill  . ALPRAZolam (XANAX) 0.25 MG tablet Take 1 tablet by mouth 3 (three) times daily.    Marland Kitchen amphetamine-dextroamphetamine (ADDERALL) 10 MG tablet Take one tablet in morning and one tablet at noon. 60 tablet 0  . Cholecalciferol (VITAMIN D-3 PO) Take 1,000 Units by mouth.    . Cyanocobalamin (B-12 PO) Take 1 tablet by mouth daily.    Marland Kitchen loteprednol (LOTEMAX) 0.5 % ophthalmic suspension Place 1 drop into the left eye daily.    . Multiple Vitamin tablet Take by mouth.    . ramipril (ALTACE) 5 MG capsule Take 5 mg by mouth daily.    . tadalafil (CIALIS) 20 MG tablet Take 10 mg by mouth as needed.     No facility-administered medications prior to visit.    PAST MEDICAL HISTORY: Past Medical History:  Diagnosis Date  . Anxiety   . BPH (benign prostatic hyperplasia)   . Decreased libido   . Difficulty concentrating   .  Erectile dysfunction   . GERD (gastroesophageal reflux disease)   . Hyperlipidemia     . Hypertension   . Lipoma of back   . Senile nuclear sclerosis     PAST SURGICAL HISTORY: Past Surgical History:  Procedure Laterality Date  . arm surgery Left   . COLONOSCOPY    . CORNEAL TRANSPLANT Bilateral   . CYST REMOVAL HAND     chest and back as well  . HERNIA REPAIR Bilateral     FAMILY HISTORY: Family History  Problem Relation Age of Onset  . Lung cancer Father     SOCIAL HISTORY: Social History   Socioeconomic History  . Marital status: Married    Spouse name: Not on file  . Number of children: Not on file  . Years of education: Not on file  . Highest education level: Not on file  Occupational History  . Not on file  Tobacco Use  . Smoking status: Former Research scientist (life sciences)  . Smokeless tobacco: Never Used  Substance and Sexual Activity  . Alcohol use: Yes    Comment: weekends  . Drug use: Never  . Sexual activity: Not on file  Other Topics Concern  . Not on file  Social History Narrative   Lives with wife   Caffeine use: 2 cups coffee per day, tea at night   Right handed    Social Determinants of Health   Financial Resource Strain:   . Difficulty of Paying Living Expenses:   Food Insecurity:   . Worried About Charity fundraiser in the Last Year:   . Arboriculturist in the Last Year:   Transportation Needs:   . Film/video editor (Medical):   Marland Kitchen Lack of Transportation (Non-Medical):   Physical Activity:   . Days of Exercise per Week:   . Minutes of Exercise per Session:   Stress:   . Feeling of Stress :   Social Connections:   . Frequency of Communication with Friends and Family:   . Frequency of Social Gatherings with Friends and Family:   . Attends Religious Services:   . Active Member of Clubs or Organizations:   . Attends Archivist Meetings:   Marland Kitchen Marital Status:   Intimate Partner Violence:   . Fear of Current or Ex-Partner:   . Emotionally Abused:   Marland Kitchen Physically Abused:   . Sexually Abused:       PHYSICAL  EXAM  Vitals:   12/17/19 0909  BP: 138/88  Pulse: 88  Temp: 97.7 F (36.5 C)  Weight: 220 lb (99.8 kg)  Height: _0  (1.778 m)   Body mass index is 31.57 kg/m.  Generalized: Well developed, in no acute distress  Cardiology: normal rate and rhythm, no murmur noted Respiratory: Clear to auscultation bilaterally Neurological examination  Mentation: Alert oriented to time, place, history taking. Follows all commands speech and language fluent Cranial nerve II-XII: Pupils were equal round reactive to light. Extraocular movements were full, visual field were full  Motor: The motor testing reveals 5 over 5 strength of all 4 extremities. Good symmetric motor tone is noted throughout.  Gait and station: Gait is normal.    DIAGNOSTIC DATA (LABS, IMAGING, TESTING) - I reviewed patient records, labs, notes, testing and imaging myself where available.  No flowsheet data found.   Lab Results  Component Value Date   WBC 7.5 03/25/2008   HGB 15.7 03/25/2008   HCT 47.3 03/25/2008   MCV 95.4 03/25/2008   PLT  252 03/25/2008      Component Value Date/Time   NA 138 03/25/2008 0913   K 5.1 MARKED HEMOLYSIS 03/25/2008 0913   CL 107 03/25/2008 0913   CO2 24 03/25/2008 0913   GLUCOSE 92 03/25/2008 0913   BUN 18 03/25/2008 0913   CREATININE 0.70 03/25/2008 0913   CALCIUM 9.9 03/25/2008 0913   GFRNONAA >60 03/25/2008 0913   GFRAA  03/25/2008 0913    >60        The eGFR has been calculated using the MDRD equation. This calculation has not been validated in all clinical   No results found for: CHOL, HDL, LDLCALC, LDLDIRECT, TRIG, CHOLHDL No results found for: HGBA1C No results found for: VITAMINB12 No results found for: TSH     ASSESSMENT AND PLAN 59 y.o. year old male  has a past medical history of Anxiety, BPH (benign prostatic hyperplasia), Decreased libido, Difficulty concentrating, Erectile dysfunction, GERD (gastroesophageal reflux disease), Hyperlipidemia, Hypertension,  Lipoma of back, and Senile nuclear sclerosis. here with     ICD-10-CM   1. Brain atrophy ([place])  G31.9   2. Memory loss  R41.3   3. Difficulty processing information  R41.89   4. OSA (obstructive sleep apnea)  G47.33     [place] is doing fairly well today.  He has noted significant improvement in attention and his ability to process information with the addition of Adderall 10 mg.  He typically takes this medication 1-2 times per day.  Neuropsychology evaluation was concerning for possible early onset of neurodegenerative dementia, most consistent with Alzheimer's as he had difficulty suggesting involvement of the parietal, temporal and frontal lobe changes.  It was suggested that he return in 9 months for retesting.  He was recently diagnosed with severe sleep apnea and is consistently using CPAP at home.  He does feel that stress is a significant contributor as he is the owner of a small business.  We have discussed the need for consistent use of CPAP to treat sleep apnea.  He was advised to consider lifestyle changes.  He will look into the MIND diet as this has been effective in treating memory loss.  Regular exercise also encouraged.  He will follow-up closely with his primary care provider should he feel that he needs pharmacological treatment for anxiety or depression.  Adequate hydration discussed.  Memory compensation strategies reviewed.  He will follow-up with me in 6 months, sooner if needed.  He verbalizes understanding and agreement with this plan.   No orders of the defined types were placed in this encounter.    No orders of the defined types were placed in this encounter.     I spent 30 minutes with the patient. 50% of this time was spent counseling and educating patient on plan of care and medications.    Debbora Presto, FNP-C 12/17/2019, 10:24 AM [facility] [address], [place], [place] [address] [phone]

## 2019-12-17 NOTE — Patient Instructions (Signed)
Continue Adderall 10mg  twice daily as needed   Consider the MIND diet  Regular exercise is recommended, Continue CPAP therapy  Continue close follow up with PCP for anxiety/depression  Follow up with me in 6 months   Attention Deficit Hyperactivity Disorder, Adult Attention deficit hyperactivity disorder (ADHD) is a mental health disorder that starts during childhood (neurodevelopmental disorder). For many people with ADHD, the disorder continues into the adult years. Treatment can help you manage your symptoms. What are the causes? The exact cause of ADHD is not known. Most experts believe genetics and environmental factors contribute to ADHD. What increases the risk? The following factors may make you more likely to develop this condition:  Having a family history of ADHD.  Being male.  Being born to a mother who smoked or drank alcohol during pregnancy.  Being exposed to lead or other toxins in the womb or early in life.  Being born before 57 weeks of pregnancy (prematurely) or at a low birth weight.  Having experienced a brain injury. What are the signs or symptoms? Symptoms of this condition depend on the type of ADHD. The two main types are inattentive and hyperactive-impulsive. Some people may have symptoms of both types. Symptoms of the inattentive type include:  Difficulty paying attention.  Making careless mistakes.  Not following instructions.  Being disorganized.  Avoiding tasks that require time and attention.  Losing and forgetting things.  Being easily distracted. Symptoms of the hyperactive-impulsive type include:  Restlessness.  Talking too much.  Interrupting.  Difficulty with: ? Sitting still. ? Feeling motivated. ? Relaxing. ? Waiting in line or waiting for a turn. In adults, this condition may lead to certain problems, such as:  Keeping jobs.  Performing tasks at work.  Having stable relationships.  Being on time or keeping to a  schedule. How is this diagnosed? This condition is diagnosed based on your current symptoms and your history of symptoms. The diagnosis can be made by a health care provider such as a primary care provider or a mental health care specialist. Your health care provider may use a symptom checklist or a behavior rating scale to evaluate your symptoms. He or she may also want to talk with people who have observed your behaviors throughout your life. How is this treated? This condition can be treated with medicines and behavior therapy. Medicines may be the best option to reduce impulsive behaviors and improve attention. Your health care provider may recommend:  Stimulant medicines. These are the most common medicines used for adult ADHD. They affect certain chemicals in the brain (neurotransmitters) and improve your ability to control your symptoms.  A non-stimulant medicine for adult ADHD (atomoxetine). This medicine increases a neurotransmitter called norepinephrine. It may take weeks to months to see effects from this medicine. Counseling and behavioral management are also important for treating ADHD. Counseling is often used along with medicine. Your health care provider may suggest:  Cognitive behavioral therapy (CBT). This type of therapy teaches you to replace negative thoughts and actions with positive thoughts and actions. When used as part of ADHD treatment, this therapy may also include: ? Coping strategies for organization, time management, impulse control, and stress reduction. ? Mindfulness and meditation training.  Behavioral management. You may work with a Leisure centre manager who is specially trained to help people with ADHD manage and organize activities and function more effectively. Follow these instructions at home: Medicines   Take over-the-counter and prescription medicines only as told by your health care provider.  Talk with your health care provider about the possible side effects of  your medicines and how to manage them. Lifestyle   Do not use drugs.  Do not drink alcohol if: ? Your health care provider tells you not to drink. ? You are pregnant, may be pregnant, or are planning to become pregnant.  If you drink alcohol: ? Limit how much you use to:  0-1 drink a day for women.  0-2 drinks a day for men. ? Be aware of how much alcohol is in your drink. In the [place]., one drink equals one 12 oz bottle of beer (355 mL), one 5 oz glass of wine (148 mL), or one 1 oz glass of hard liquor (44 mL).  Get enough sleep.  Eat a healthy diet.  Exercise regularly. Exercise can help to reduce stress and anxiety. General instructions  Learn as much as you can about adult ADHD, and work closely with your health care providers to find the treatments that work best for you.  Follow the same schedule each day.  Use reminder devices like notes, calendars, and phone apps to stay on time and organized.  Keep all follow-up visits as told by your health care provider and therapist. This is important. Where to find more information A health care provider may be able to recommend resources that are available online or over the phone. You could start with:  Attention Deficit Disorder Association (ADDA): PubAddiction.co.nz  National Institute of Mental Health [facility] [place]): https://carter.com/ Contact a health care provider if:  Your symptoms continue to cause problems.  You have side effects from your medicine, such as: ? Repeated muscle twitches, coughing, or speech outbursts. ? Sleep problems. ? Loss of appetite. ? Dizziness. ? Unusually fast heartbeat. ? Stomach pains. ? Headaches.  You are struggling with anxiety, depression, or substance abuse. Get help right away if you:  Have a severe reaction to a medicine. If you ever feel like you may hurt yourself or others, or have thoughts about taking your own life, get help right away. You can go to the nearest emergency department or  call:  Your local emergency services (911 in the U.S[place]  A suicide crisis helpline, such as the [facility] at 765-285-1465. This is open 24 hours a day. Summary  ADHD is a mental health disorder that starts during childhood (neurodevelopmental disorder) and often continues into the adult years.  The exact cause of ADHD is not known. Most experts believe genetics and environmental factors contribute to ADHD.  There is no cure for ADHD, but treatment with medicine, cognitive behavioral therapy, or behavioral management can help you manage your condition. This information is not intended to replace advice given to you by your health care provider. Make sure you discuss any questions you have with your health care provider. Document Revised: 01/13/2019 Document Reviewed: 01/13/2019 Elsevier Patient Education  [place].   Sleep Apnea Sleep apnea affects breathing during sleep. It causes breathing to stop for a short time or to become shallow. It can also increase the risk of:  Heart attack.  Stroke.  Being very overweight (obese).  Diabetes.  Heart failure.  Irregular heartbeat. The goal of treatment is to help you breathe normally again. What are the causes? There are three kinds of sleep apnea:  Obstructive sleep apnea. This is caused by a blocked or collapsed airway.  Central sleep apnea. This happens when the brain does not send the right signals to the muscles that  control breathing.  Mixed sleep apnea. This is a combination of obstructive and central sleep apnea. The most common cause of this condition is a collapsed or blocked airway. This can happen if:  Your throat muscles are too relaxed.  Your tongue and tonsils are too large.  You are overweight.  Your airway is too small. What increases the risk?  Being overweight.  Smoking.  Having a small airway.  Being older.  Being male.  Drinking alcohol.  Taking  medicines to calm yourself (sedatives or tranquilizers).  Having family members with the condition. What are the signs or symptoms?  Trouble staying asleep.  Being sleepy or tired during the day.  Getting angry a lot.  Loud snoring.  Headaches in the morning.  Not being able to focus your mind (concentrate).  Forgetting things.  Less interest in sex.  Mood swings.  Personality changes.  Feelings of sadness (depression).  Waking up a lot during the night to pee (urinate).  Dry mouth.  Sore throat. How is this diagnosed?  Your medical history.  A physical exam.  A test that is done when you are sleeping (sleep study). The test is most often done in a sleep lab but may also be done at home. How is this treated?   Sleeping on your side.  Using a medicine to get rid of mucus in your nose (decongestant).  Avoiding the use of alcohol, medicines to help you relax, or certain pain medicines (narcotics).  Losing weight, if needed.  Changing your diet.  Not smoking.  Using a machine to open your airway while you sleep, such as: ? An oral appliance. This is a mouthpiece that shifts your lower jaw forward. ? A CPAP device. This device blows air through a mask when you breathe out (exhale). ? An EPAP device. This has valves that you put in each nostril. ? A BPAP device. This device blows air through a mask when you breathe in (inhale) and breathe out.  Having surgery if other treatments do not work. It is important to get treatment for sleep apnea. Without treatment, it can lead to:  High blood pressure.  Coronary artery disease.  In men, not being able to have an erection (impotence).  Reduced thinking ability. Follow these instructions at home: Lifestyle  Make changes that your doctor recommends.  Eat a healthy diet.  Lose weight if needed.  Avoid alcohol, medicines to help you relax, and some pain medicines.  Do not use any products that contain  nicotine or tobacco, such as cigarettes, e-cigarettes, and chewing tobacco. If you need help quitting, ask your doctor. General instructions  Take over-the-counter and prescription medicines only as told by your doctor.  If you were given a machine to use while you sleep, use it only as told by your doctor.  If you are having surgery, make sure to tell your doctor you have sleep apnea. You may need to bring your device with you.  Keep all follow-up visits as told by your doctor. This is important. Contact a doctor if:  The machine that you were given to use during sleep bothers you or does not seem to be working.  You do not get better.  You get worse. Get help right away if:  Your chest hurts.  You have trouble breathing in enough air.  You have an uncomfortable feeling in your back, arms, or stomach.  You have trouble talking.  One side of your body feels weak.  A  part of your face is hanging down. These symptoms may be an emergency. Do not wait to see if the symptoms will go away. Get medical help right away. Call your local emergency services (911 in the U.S.). Do not drive yourself to the hospital. Summary  This condition affects breathing during sleep.  The most common cause is a collapsed or blocked airway.  The goal of treatment is to help you breathe normally while you sleep. This information is not intended to replace advice given to you by your health care provider. Make sure you discuss any questions you have with your health care provider. Document Revised: 06/07/2018 Document Reviewed: 04/16/2018 Elsevier Patient Education  [place]  11. Use "WARM" strategy.  W= write it down  A= associate it  R= repeat it  M= make a mental note  2.   You can keep a Social worker.  Use a 3-ring notebook with sections for the following: calendar, important names and phone numbers,  medications, doctors' names/phone numbers,  lists/reminders, and a section to journal what you did  each day.   3.    Use a calendar to write appointments down.  4.    Write yourself a schedule for the day.  This can be placed on the calendar or in a separate section of the Memory Notebook.  Keeping a  regular schedule can help memory.  5.    Use medication organizer with sections for each day or morning/evening pills.  You may need help loading it  6.    Keep a basket, or pegboard by the door.  Place items that you need to take out with you in the basket or on the pegboard.  You may also want to  include a message board for reminders.  7.    Use sticky notes.  Place sticky notes with reminders in a place where the task is performed.  For example: " turn off the  stove" placed by the stove, "lock the door" placed on the door at eye level, " take your medications" on  the bathroom mirror or by the place where you normally take your medications.  8.    Use alarms/timers.  Use while cooking to remind yourself to check on food or as a reminder to take your medicine, or as a  reminder to make a call, or as a reminder to perform another task, etc.

## 2019-12-17 NOTE — Progress Notes (Signed)
I have read the note, and I agree with the clinical assessment and plan.  Madison Albea A. Mays Paino, MD, PhD, FAAN Certified in Neurology, Clinical Neurophysiology, Sleep Medicine, Pain Medicine and Neuroimaging  [facility] [address], [address] [place], [place] [address] [phone]  

## 2020-01-27 ENCOUNTER — Other Ambulatory Visit: Payer: Self-pay | Admitting: Family Medicine

## 2020-01-27 MED ORDER — AMPHETAMINE-DEXTROAMPHETAMINE 10 MG PO TABS: ORAL_TABLET | ORAL | 0 refills | Status: DC

## 2020-01-27 NOTE — Telephone Encounter (Signed)
Pt is up to date on his appts and is due for a refill on adderall. [place] Controlled Substance Registry checked and is appropriate.

## 2020-01-27 NOTE — Addendum Note (Signed)
Addended by: Lester Mankato A on: 01/27/2020 09:57 AM   Modules accepted: Orders

## 2020-01-27 NOTE — Telephone Encounter (Signed)
Pt's wife has called for a refill on amphetamine-dextroamphetamine (ADDERALL) 10 MG tablet to  [place]

## 2020-02-27 ENCOUNTER — Other Ambulatory Visit: Payer: Self-pay | Admitting: Family Medicine

## 2020-02-27 NOTE — Telephone Encounter (Signed)
Pt is needing a refill on his amphetamine-dextroamphetamine (ADDERALL) 10 MG tablet sent in to the [facility] on [place].

## 2020-03-01 MED ORDER — AMPHETAMINE-DEXTROAMPHETAMINE 10 MG PO TABS: ORAL_TABLET | ORAL | 0 refills | Status: DC

## 2020-04-13 ENCOUNTER — Other Ambulatory Visit: Payer: Self-pay | Admitting: Family Medicine

## 2020-04-13 MED ORDER — AMPHETAMINE-DEXTROAMPHETAMINE 10 MG PO TABS: ORAL_TABLET | ORAL | 0 refills | Status: DC

## 2020-04-13 NOTE — Telephone Encounter (Signed)
Pt's wife, Emrik Erhard request refill amphetamine-dextroamphetamine (ADDERALL) 10 MG tablet at [facility]

## 2020-04-16 ENCOUNTER — Other Ambulatory Visit: Payer: Self-pay | Admitting: Family Medicine

## 2020-04-16 NOTE — Telephone Encounter (Signed)
Pt's wife called stating that the pt's amphetamine-dextroamphetamine (ADDERALL) 10 MG tablet was called in to the wrong pharmacy. This medication needs to be called in to the [facility] in [facility]. Please advise.

## 2020-04-19 MED ORDER — AMPHETAMINE-DEXTROAMPHETAMINE 10 MG PO TABS: ORAL_TABLET | ORAL | 0 refills | Status: DC

## 2020-04-19 NOTE — Telephone Encounter (Signed)
Called [facility] in [place] and cancelled the prescription.  Spoke to [place].

## 2020-05-28 ENCOUNTER — Other Ambulatory Visit: Payer: Self-pay | Admitting: Family Medicine

## 2020-05-28 NOTE — Telephone Encounter (Signed)
Pt request refill amphetamine-dextroamphetamine (ADDERALL) 10 MG tablet at [place]

## 2020-06-01 MED ORDER — AMPHETAMINE-DEXTROAMPHETAMINE 10 MG PO TABS: ORAL_TABLET | ORAL | 0 refills | Status: DC

## 2020-06-01 NOTE — Addendum Note (Signed)
Addended by: Minna Antis on: 06/01/2020 04:40 PM   Modules accepted: Orders

## 2020-06-22 ENCOUNTER — Encounter: Payer: Self-pay | Admitting: Family Medicine

## 2020-06-22 ENCOUNTER — Telehealth (INDEPENDENT_AMBULATORY_CARE_PROVIDER_SITE_OTHER): Payer: 59 | Admitting: Family Medicine

## 2020-06-22 DIAGNOSIS — F988 Other specified behavioral and emotional disorders with onset usually occurring in childhood and adolescence: Secondary | ICD-10-CM | POA: Diagnosis not present

## 2020-06-22 DIAGNOSIS — G319 Degenerative disease of nervous system, unspecified: Secondary | ICD-10-CM | POA: Diagnosis not present

## 2020-06-22 NOTE — Progress Notes (Signed)
PATIENT: Chad Tran DOB: 1961/08/12  REASON FOR VISIT: follow up HISTORY FROM: patient  Virtual Visit via Telephone Note  I connected with Maryan Rued on 06/22/20 at 10:30 AM EDT by telephone and verified that I am speaking with the correct person using two identifiers.   I discussed the limitations, risks, security and privacy concerns of performing an evaluation and management service by telephone and the availability of in person appointments. I also discussed with the patient that there may be a patient responsible charge related to this service. The patient expressed understanding and agreed to proceed.   History of Present Illness:  06/22/20 Chad Tran is a 59 y.o. male here today for follow up inattention. He continues Adderall 10mg  IR daily. He feels that it helps with keeping him on task at work. He has taken 1.5 tablets from time to time and feels that it helps a little more. He rarely takes an afternoon or weekend dose as he is not working and does not feel it is needed. He has been working on healthy lifestyle habits with a mediterranian diet and regular physical activity. He has lost about 20 pounds. He denies adverse effects of medication. Memory stable.   History (copied from my note on 12/17/2019)  Chad Tran is a 59 y.o. male here today for follow up for subjective memory complaints and inattention. He was started on Adderall XR 15mg  in 07/2019. Insurance would not cover medication so dosing was switched to IR 10mg  twice daily. He usually only takes one tablet. Sometimes he will take the second dose. He does feel that this medication helps to keep him focused. He feels more alert. Math has been easier for him. 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 Appetite is good. Weight is stable. No denies cardiac side effects. He is using CPAP nightly.   HISTORY: (copied from Dr Garth Bigness note on 07/23/2019)  He was recently diagnosed with severe OSA (AHI = 39.7) and is  using BiPAP nightly. He was titrated to BiPAP 19/15. He feels less sleepy. However, he does not feel it has helped the cognition much.   He is noting issues reduced focus/attention, telling time on analog clock. He feels he does worse by the end of the day.   He tried a friend's ADD medication and it did help some.He does not know the name of the medication but thinks it was Adderall.  He is scheduled to have neurocognitive evaluation in early January.  HISTORY OF PRESENT ILLNESS:  I had the pleasure of seeing your patient, Chad Tran, at [facility] for neurologic consultation regarding his progressive cognitive difficulties.  He is a 59 year old man who has noted issues with processing information. He has noted planning and executing tasks is more difficult. He has noted that he has had difficulty telling time on an analog clock. Symtoms fluctuate but he feels the number of bad days is progressively worsening. He notes some difficulty with memory but has had more problems with processing the memory.  He is driving but feels he needs to pay much more attention than he used to. He has made some errors in driving form point to point but has never been completely lost.   He has tinnitus and has often needed to focus more during conversatoin. This is a little worse.   He has had problems coming up with the right words. He writes for fun and this is much more difficult. 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 He used to be good at math and now has difficulty. He can't estimate like he used to and is reliant on a a calculator.   He is more quiet than he used to be but no behavioral. He denies depression and he has no irritability.   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 He needs to work >40 hours many weeks. He needed to get a bookkeeper to help earlier this year to do tasks he  was doing. He has more trouble estimating.   His wife has noted OSA signs at night with snoring, pauses, gasps, worse on his back.  MRI of the brain 03/28/2019 was interpreted as showing mild chronic microvascular ischemic change and was otherwise normal. However, I personally reviewed the MRI of the brain dated 03/28/2019 and feel that there is very significant atrophy, much more than expected for age especially in the parietal lobes, to lesser extent in the occipital and frontal lobes. The medial temporal lobes did not show any atrophy. There is also mild chronic microvascular ischemic change, a little more than expected for age. I also reviewed recent lab work. B12 and TSH and other labs are normal.  Vascular risk factors: Hypertension.  Montreal Cognitive Assessment  04/22/2019  Visuospatial/ Executive (0/5) 3  Naming (0/3) 3  Attention: Read list of digits (0/2) 2  Attention: Read list of letters (0/1) 1  Attention: Serial 7 subtraction starting at 100 (0/3) 1  Language: Repeat phrase (0/2) 2  Language : Fluency (0/1) 0  Abstraction (0/2) 2  Delayed Recall (0/5) 0  Orientation (0/6) 6  Total 20  Adjusted Score (based on education) 20    EPWORTH SLEEPINESS SCALE  On a scale of 0 - 3 what is the chance of dozing:  Sitting and Reading:2 Watching TV:2 Sitting inactive in a public place:2 Passenger in car for one hour:2 Lying down to rest in the afternoon:1 Sitting and talking to someone:0 Sitting quietly after lunch:3 In a car, stopped in traffic:0  Total (out of 24): 12/24 (mild excessive sleepiness)     Observations/Objective:  Generalized: Well developed, in no acute distress  Mentation: Alert oriented to time, place, history taking. Follows all commands speech and language fluent   Assessment and Plan:  59 y.o.  year old male  has a past medical history of Anxiety, BPH (benign prostatic hyperplasia), Decreased libido, Difficulty concentrating, Erectile dysfunction, GERD (gastroesophageal reflux disease), Hyperlipidemia, Hypertension, Lipoma of back, and Senile nuclear sclerosis. here with    ICD-10-CM   1. Brain atrophy ([place])  G31.9   2. Adult attention deficit disorder  F98.8    Daniyal is doing well on Adderall. He has noted improved symptoms when taking 1.5 tablets daily. We will increase dose to 115-20mg  (1.5-2 tablets) every morning. He may skip dose on the weekends. He will continue to monitor for adverse effects as discussed. PDMP shows appropriate refills, last filled 06/01/2020. Healthy lifestyle habits encouraged. He will follow up in office in 6 months. He verbalizes understanding and agreement with this plan.    No orders of the defined types were placed in this encounter.   No orders of the defined types were placed in this encounter.    Follow Up Instructions:  I discussed the assessment and treatment plan with the patient. The patient was provided an opportunity to ask questions and all were answered. The patient agreed with the plan and demonstrated an understanding  of the instructions.   The patient was advised to call back or seek an in-person evaluation if the symptoms worsen or if the condition fails to improve as anticipated.  I provided 15 minutes of non-face-to-face time during this encounter. Patient and his wife are at their place of residence during [place] visit. Provider is in the office.    Debbora Presto, NP

## 2020-06-22 NOTE — Progress Notes (Signed)
I have read the note, and I agree with the clinical assessment and plan.  Shekela Goodridge A. Adamari Frede, MD, PhD, FAAN Certified in Neurology, Clinical Neurophysiology, Sleep Medicine, Pain Medicine and Neuroimaging  [facility] [address], [address] [place], [place] [address] [phone]  

## 2020-08-02 ENCOUNTER — Other Ambulatory Visit: Payer: Self-pay | Admitting: Family Medicine

## 2020-08-02 NOTE — Telephone Encounter (Signed)
Pt is needing a refill on his amphetamine-dextroamphetamine (ADDERALL) 10 MG tablet sent in to the [facility] on [address]

## 2020-08-03 MED ORDER — AMPHETAMINE-DEXTROAMPHETAMINE 10 MG PO TABS: ORAL_TABLET | ORAL | 0 refills | Status: DC

## 2020-09-02 ENCOUNTER — Other Ambulatory Visit: Payer: Self-pay | Admitting: Family Medicine

## 2020-09-02 MED ORDER — AMPHETAMINE-DEXTROAMPHETAMINE 10 MG PO TABS: ORAL_TABLET | ORAL | 0 refills | Status: DC

## 2020-09-02 NOTE — Telephone Encounter (Signed)
Pt is needing a refill on his amphetamine-dextroamphetamine (ADDERALL) 10 MG tablet sent in to the [facility] on [address] in [place]

## 2020-09-09 ENCOUNTER — Ambulatory Visit: Payer: 59 | Admitting: Psychology

## 2020-09-30 ENCOUNTER — Encounter: Payer: 59 | Admitting: Psychology

## 2020-10-13 ENCOUNTER — Other Ambulatory Visit: Payer: Self-pay | Admitting: Family Medicine

## 2020-10-13 NOTE — Addendum Note (Signed)
Addended by: Brandon Melnick on: 10/13/2020 11:31 AM   Modules accepted: Orders

## 2020-10-13 NOTE — Telephone Encounter (Signed)
Wife has called for a refill on amphetamine-dextroamphetamine (ADDERALL) 10 MG tablet to  [place]

## 2020-10-14 MED ORDER — AMPHETAMINE-DEXTROAMPHETAMINE 10 MG PO TABS: ORAL_TABLET | ORAL | 0 refills | Status: DC

## 2020-11-15 ENCOUNTER — Telehealth: Payer: Self-pay | Admitting: Family Medicine

## 2020-11-15 MED ORDER — AMPHETAMINE-DEXTROAMPHETAMINE 10 MG PO TABS: ORAL_TABLET | ORAL | 0 refills | Status: DC

## 2020-11-15 NOTE — Telephone Encounter (Signed)
[place] drug registry has been checked, pt is up to date on appointments, last seen with [facility] 06/2020, has upcoming appointment with [facility] 12/2020. Refill is appropriate, please advise

## 2020-11-15 NOTE — Telephone Encounter (Signed)
Pt needs a refill of amphetamine-dextroamphetamine (ADDERALL) 10 MG tablet to  [facility]

## 2020-11-15 NOTE — Addendum Note (Signed)
Addended by: Arlice Colt A on: 11/15/2020 05:59 PM   Modules accepted: Orders

## 2020-12-07 ENCOUNTER — Other Ambulatory Visit: Payer: Self-pay

## 2020-12-07 ENCOUNTER — Encounter: Payer: 59 | Attending: Psychology | Admitting: Psychology

## 2020-12-07 DIAGNOSIS — G4733 Obstructive sleep apnea (adult) (pediatric): Secondary | ICD-10-CM

## 2020-12-07 DIAGNOSIS — G300 Alzheimer's disease with early onset: Secondary | ICD-10-CM

## 2020-12-07 DIAGNOSIS — F0280 Dementia in other diseases classified elsewhere without behavioral disturbance: Secondary | ICD-10-CM

## 2020-12-07 DIAGNOSIS — R413 Other amnesia: Secondary | ICD-10-CM | POA: Diagnosis not present

## 2020-12-15 ENCOUNTER — Telehealth: Payer: Self-pay | Admitting: Neurology

## 2020-12-15 NOTE — Telephone Encounter (Signed)
Moved appointment from 4 pm to 9 am due to MD leaving early.

## 2020-12-16 ENCOUNTER — Encounter: Payer: Self-pay | Admitting: Psychology

## 2020-12-16 NOTE — Progress Notes (Signed)
12/07/2020: Today's visit was an in person visit was conducted in my outpatient clinic office.  On today's visit the patient reports that he is doing "better" and has been prescribed a BiPAP and is working on getting it set up and started using again.  The patient has not been having as much of a problem as he did when I saw him before reports that his ability to articulate was very bad but has been getting better with expressive language.  Patient reports that he does not feel like he is in a "fog" as much as before and feels like his memory has improved some.  The patient reports that when he loses objects around the house that he will stop looking for them for a period of time and then will later find that and tends to remember it later.  The patient reports that his sleep has improved.  The patient reports that he is still having trouble with geographic orientation and he has to be more careful finding is awake around town when he is driving.  The patient reports that some days he is better with his orientation but he is still relying on GPS for direction.  The patient feels like he is just gradually over time getting better and learning to compensate.  The patient had difficulty calculating and working with mathematics and numbers but he is doing a little bit better there as well.  The patient is still taking Adderall and has been working on decreasing how much Xanax he is taken.  His goal is to taper down to the point that he completely stops Xanax.  He is now taking one half of a 0.25 Xanax 3 times a day.  Today's visit was a follow-up assessment to determine whether there is been any progression in his cognitive decline over the past 9 to 12 months.  We will repeat the previous assessment battery for objective assessment of decline.  While the patient reports that he has improved and is now taking Lexapro and reducing his Xanax and working with his BiPAP to improve sleep there are continued difficulties.   I will include the summary from the evaluation that can be found in the patient's EMR dated 11/27/2019.  Impression/Diagnosis:                     Overall, the results of the current neuropsychological evaluation utilizing data sets including subjective symptoms, current medical/neurological information as well as objective neuropsychological testing do show great consistency between the patient's subjective reports of difficulties with memory and learning and forgetting conversations with others, difficulties with attention and concentration, geographic disorientation and visual-spatial deficits, difficulties maintaining and performing at work for even basic mathematical problems that would have been quite easy prior, issues with problem-solving and reasoning abilities, and difficulty retrieving historical information and memories.  While the patient also has a history of prior head injury with significant loss of consciousness in 1979 he did quite well for all these years afterwards and likely returned to baseline with little long-term residual effects of this concussive event.  Current medical information includes a family history of Parkinson's disease with both his father and brother and the patient currently showing some pill-rolling motor symptoms but no other tremors noted.  The patient denies any visual hallucinations.  He is being treated for significant obstructive sleep apnea with a BiPAP machine.  The patient also has had recent MRI studies that showed mild chronic microvascular ischemic changes as well  as very significant atrophy especially in the parietal lobes and to a lesser extent occipital and frontal lobes.  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  While there is a very strong family history of diagnosis of Parkinson's disease and he does have some indication of pill-rolling behavior there are no other tremors  noted.  The degree of microvascular ischemic changes would not explain the level of cognitive deficits both objectively identified or subjectively reported to attribute these changes to cerebrovascular issues and he has no indication of previous strokes.  The patient's objective neuropsychological testing would also suggest involvement of parietal lobe temporal lobe and frontal lobe changes.  The patient describes a progressive decline over the past year with significant loss of functioning over this time.  While sleep apneas can produce significant memory and cognitive impacts the subjective reports and objective assessments would go beyond that typically seen with simple untreated sleep apneas.  The patient is also been diagnosed and treated for sleep apneas and is now consistently using a BiPAP machine with cognitive functioning only slightly improving as a result.  As this is the first objective assessment it will be important to reassess the patient in approximately 9 months to establish if there is further indications of objective findings of progressive decline.  We will set the patient up for follow-up repeat testing in approximately 9 months to complete this process for more definitive diagnostic consideration.  I will provide feedback to the patient regarding the results of the current neuropsychological evaluation and we will review specific recommendations going forward.  I will also make sure that this report is available in his electronic medical records for his treating neurologist to have available.  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 Neuropsychologist

## 2020-12-20 NOTE — Telephone Encounter (Signed)
Noted  

## 2020-12-20 NOTE — Telephone Encounter (Signed)
Pt's wife called in and sts the 900 on 4/21 will not work due to pt's work schedule.  I have rescheduled pt for next available 4 pm which would be 01/17/21.   Wife verbalized appreciation.

## 2020-12-23 ENCOUNTER — Ambulatory Visit: Payer: 59 | Admitting: Neurology

## 2021-01-11 ENCOUNTER — Other Ambulatory Visit: Payer: Self-pay | Admitting: Family Medicine

## 2021-01-11 MED ORDER — AMPHETAMINE-DEXTROAMPHETAMINE 10 MG PO TABS: ORAL_TABLET | ORAL | 0 refills | Status: DC

## 2021-01-11 NOTE — Telephone Encounter (Signed)
Pt request refill amphetamine-dextroamphetamine (ADDERALL) 10 MG tablet at [facility]  

## 2021-01-11 NOTE — Telephone Encounter (Signed)
appt 01-17-21 with Dr. Felecia Shelling.

## 2021-01-17 ENCOUNTER — Ambulatory Visit (INDEPENDENT_AMBULATORY_CARE_PROVIDER_SITE_OTHER): Payer: 59 | Admitting: Neurology

## 2021-01-17 ENCOUNTER — Encounter: Payer: Self-pay | Admitting: Neurology

## 2021-01-17 VITALS — BP 141/79 | HR 69 | Wt 207.0 lb

## 2021-01-17 DIAGNOSIS — F988 Other specified behavioral and emotional disorders with onset usually occurring in childhood and adolescence: Secondary | ICD-10-CM

## 2021-01-17 DIAGNOSIS — G319 Degenerative disease of nervous system, unspecified: Secondary | ICD-10-CM | POA: Diagnosis not present

## 2021-01-17 DIAGNOSIS — G4733 Obstructive sleep apnea (adult) (pediatric): Secondary | ICD-10-CM

## 2021-01-17 DIAGNOSIS — R4189 Other symptoms and signs involving cognitive functions and awareness: Secondary | ICD-10-CM | POA: Diagnosis not present

## 2021-01-17 MED ORDER — AMPHETAMINE-DEXTROAMPHETAMINE 15 MG PO TABS: ORAL_TABLET | ORAL | 0 refills | Status: DC

## 2021-01-17 NOTE — Progress Notes (Signed)
[facility]  PATIENT: Chad Tran DOB: 05/17/1961  REFERRING DOCTOR OR PCP:  Kathryne Eriksson, MD; Clyde Lundborg SOURCE: Patient, notes from primary care, imaging and laboratory reports, MRI of the brain personally reviewed.  _________________________________   HISTORICAL  CHIEF COMPLAINT:  Chief Complaint  Patient presents with  . Follow-up    RM 81 with wife Last seen 06/22/2020 w/ VV AL,NP. Follow up on brain atrophy/on adderall. Last MOCA 20/30. Wanted to hold off on memory testing, states he is going to Dr. Sima Matas this [facility] for memory testing. States Adderall works well, has to take brand name (generic not as effective). Gets at [employer] and uses goodrx coupon.   Update 01/17/2021: He notes some difficulty with focus and attention.   He notes that he was doing well since starting Adderall.   He felt the brand did better than the generic.   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    He notes sentence construction is occasionally poor.   He has had difficutly telling time on an analog clock.   He feels cognition is worse when tired.  Adderall has helped cognition and ADD.   He is scheduled to have neurocognitive evaluation in early January.  He has severe OSA (AHI = 39.7)  and is using BiPAP nightly.  He was titrated to BiPAP 19/15.     He feels less sleepy.   However, he does not feel it has helped the cognition much.    He has trouble tolerating it so is being evaluated for the [facility] device.     HISTORY OF COGNITIVE ISSUES:  I first saw him 04/22/2019.  At the time he presented with processing and attentional issues for the previous 6-12 months.   He noted planning and executing tasks were more difficult.  He has noted that he has had difficulty telling time on an analog clock.      He notes some difficulty with memory but has had more problems with processing  the memory.  He is driving but feels he needs to pay much more attention than he used to.  He has made some errors in driving form point to point but has never been completely lost.   He has tinnitus and has often needed to focus more during conversatoin.   This is a little worse.      He has had problems coming up with the right words. He writes for fun and this is much more difficult.    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   He used to be good at math and now has difficulty.   He can't estimate like he used to and is reliant on a a calculator.      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   He needs to work > 40 hours many weeks.     He has more trouble estimating jobs.    MRI of the brain 03/28/2019 was interpreted as showing mild chronic microvascular ischemic change and was otherwise normal.  However, I personally reviewed the MRI of the brain dated 03/28/2019 and feel that there is very significant atrophy, much more than expected for age especially in the parietal lobes, to lesser extent in the occipital and frontal lobes.  The medial temporal lobes did not show any atrophy.  There  is also mild chronic microvascular ischemic change, a little more than expected for age.  I also reviewed recent lab work.  B12 and TSH and other labs are normal.  Vascular risk factors: Hypertension.  Montreal Cognitive Assessment  04/22/2019  Visuospatial/ Executive (0/5) 3  Naming (0/3) 3  Attention: Read list of digits (0/2) 2  Attention: Read list of letters (0/1) 1  Attention: Serial 7 subtraction starting at 100 (0/3) 1  Language: Repeat phrase (0/2) 2  Language : Fluency (0/1) 0  Abstraction (0/2) 2  Delayed Recall (0/5) 0  Orientation (0/6) 6  Total 20  Adjusted Score (based on education) 20    EPWORTH SLEEPINESS SCALE  On a scale of 0 - 3 what is the chance of dozing:  Sitting and Reading:   2 Watching TV:    2 Sitting inactive in a public  place: 2 Passenger in car for one hour: 2 Lying down to rest in the afternoon: 1 Sitting and talking to someone: 0 Sitting quietly after lunch:  3 In a car, stopped in traffic:  0  Total (out of 24): 12/24 (mild excessive sleepiness)    REVIEW OF SYSTEMS: Constitutional: No fevers, chills, sweats, or change in appetite Eyes: No visual changes, double vision, eye pain Ear, nose and throat: No hearing loss, ear pain, nasal congestion, sore throat Cardiovascular: No chest pain, palpitations Respiratory: No shortness of breath at rest or with exertion.   No wheezes.  His wife notes severe snoring and pauses in his breathing at night. GastrointestinaI: No nausea, vomiting, diarrhea, abdominal pain, fecal incontinence Genitourinary: No dysuria, urinary retention or frequency.  No nocturia. Musculoskeletal: No neck pain, back pain Integumentary: No rash, pruritus, skin lesions Neurological: as above Psychiatric: No depression at this time.  No anxiety Endocrine: No palpitations, diaphoresis, change in appetite, change in weigh or increased thirst Hematologic/Lymphatic: No anemia, purpura, petechiae. Allergic/Immunologic: No itchy/runny eyes, nasal congestion, recent allergic reactions, rashes  ALLERGIES: No Known Allergies  HOME MEDICATIONS:  Current Outpatient Medications:  .  ALPRAZolam (XANAX) 0.25 MG tablet, Take 1 tablet by mouth 3 (three) times daily., Disp: , Rfl:  .  atorvastatin (LIPITOR) 80 MG tablet, Take 80 mg by mouth daily., Disp: , Rfl:  .  loteprednol (LOTEMAX) 0.5 % ophthalmic suspension, Place 1 drop into the left eye daily., Disp: , Rfl:  .  Multiple Vitamin tablet, Take by mouth., Disp: , Rfl:  .  ramipril (ALTACE) 5 MG capsule, Take 5 mg by mouth daily., Disp: , Rfl:  .  tadalafil (CIALIS) 20 MG tablet, Take 10 mg by mouth as needed., Disp: , Rfl:  .  amphetamine-dextroamphetamine (ADDERALL) 15 MG tablet, Take one tablet in morning and one tablet at noon.,  Disp: 60 tablet, Rfl: 0  PAST MEDICAL HISTORY: Past Medical History:  Diagnosis Date  . Anxiety   . BPH (benign prostatic hyperplasia)   . Decreased libido   . Difficulty concentrating   . Erectile dysfunction   . GERD (gastroesophageal reflux disease)   . Hyperlipidemia   . Hypertension   . Lipoma of back   . Senile nuclear sclerosis     PAST SURGICAL HISTORY: Past Surgical History:  Procedure Laterality Date  . arm surgery Left   . COLONOSCOPY    . CORNEAL TRANSPLANT Bilateral   . CYST REMOVAL HAND     chest and back as well  . HERNIA REPAIR Bilateral     FAMILY HISTORY: Family History  Problem Relation Age of  Onset  . Lung cancer Father     SOCIAL HISTORY:  Social History   Socioeconomic History  . Marital status: Married    Spouse name: Not on file  . Number of children: Not on file  . Years of education: Not on file  . Highest education level: Not on file  Occupational History  . Not on file  Tobacco Use  . Smoking status: Former Research scientist (life sciences)  . Smokeless tobacco: Never Used  Substance and Sexual Activity  . Alcohol use: Yes    Comment: weekends  . Drug use: Never  . Sexual activity: Not on file  Other Topics Concern  . Not on file  Social History Narrative   Lives with wife   Caffeine use: 2 cups coffee per day, tea at night   Right handed    Social Determinants of Health   Financial Resource Strain: Not on file  Food Insecurity: Not on file  Transportation Needs: Not on file  Physical Activity: Not on file  Stress: Not on file  Social Connections: Not on file  Intimate Partner Violence: Not on file     PHYSICAL EXAM  Vitals:   01/17/21 1558  BP: (!) 141/79  Pulse: 69  Weight: 207 lb (93.9 kg)    Body mass index is 29.7 kg/m.   General: The patient is well-developed and well-nourished and in no acute distress  HEENT:  Head is South /AT.  Sclera are anicteric.    Skin: Extremities are without rash or  edema.  Neurologic  Exam  Mental status: The patient is alert and oriented x 3 at the time of the examination. The patient has reduced focus (100- 93-?).     Speech was fluent and grammar was fine      Cranial nerves: Extraocular movements are full OD.  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  The right pupil is postoperative and he has reduced right vision. Facial symmetry is present. There is good facial sensation to soft touch bilaterally.Facial strength is normal.  Trapezius and sternocleidomastoid strength is normal. No dysarthria is noted.  No obvious hearing deficits are noted.  Motor:  Muscle bulk is normal.   Tone is normal. Strength is  5 / 5 in all 4 extremities.   Sensory: Sensory testing is intact to pinprick, soft touch and vibration sensation in all 4 extremities.  Coordination: Cerebellar testing reveals good finger-nose-finger and heel-to-shin bilaterally.  Gait and station: Station is normal.   Gait is normal. Tandem gait is mildly wide. Romberg is negative.   Reflexes: Deep tendon reflexes are symmetric and normal bilaterally.        DIAGNOSTIC DATA (LABS, IMAGING, TESTING) - I reviewed patient records, labs, notes, testing and imaging myself where available.  Lab Results  Component Value Date   WBC 7.5 03/25/2008   HGB 15.7 03/25/2008   HCT 47.3 03/25/2008   MCV 95.4 03/25/2008   PLT 252 03/25/2008      Component Value Date/Time   NA 138 03/25/2008 0913   K 5.1 MARKED HEMOLYSIS 03/25/2008 0913   CL 107 03/25/2008 0913   CO2 24 03/25/2008 0913   GLUCOSE 92 03/25/2008 0913   BUN 18 03/25/2008 0913   CREATININE 0.70 03/25/2008 0913   CALCIUM 9.9 03/25/2008 0913   GFRNONAA >60 03/25/2008 0913   GFRAA  03/25/2008 0913    >60        The eGFR has been calculated using the MDRD equation. This calculation has not been validated in all clinical  ASSESSMENT AND PLAN    1. Brain atrophy ([place])   2. Difficulty processing information   3. Adult attention deficit disorder   4. OSA  (obstructive sleep apnea)      1.  He has atrophy that is predominantly in the parietal lobes and to a lesser extent occipital lobes with very little atrophy in the frontal and temporal lobes.  The extent is more than typical for age.  He also has mild chronic microvascular ischemic change that is less likely to be clinically significant.  He will be doing neurocognitive testing later this week 2.   He is seeing Dr. [facility]) for an [place] evaluation.   He has had issues keeping CPAP on all night. 3.   Change prescription for Adderall to 15 mg bid.  He will be having neurocognitive testing later this week.  We also discussed that based on the results of the neurocognitive evaluation and due to his younger age, we might want to consider an MRI PET scan to determine if he could have early onset Alzheimer's disease or posterior cortical atrophy (both are amyloid apathies) 4.   Stay active and exercise as tolerated. 5.. Return in 4 months or sooner for new or worsening neurologic symptoms.  Nallely Yost A. Felecia Shelling, MD, PhD, FAAN Certified in Neurology, Clinical Neurophysiology, Sleep Medicine, Pain Medicine and Neuroimaging Director, [place] [facility] [address] [place], [place] [address] [phone]

## 2021-01-21 ENCOUNTER — Encounter: Payer: Self-pay | Admitting: Psychology

## 2021-01-21 ENCOUNTER — Other Ambulatory Visit: Payer: Self-pay

## 2021-01-21 ENCOUNTER — Encounter: Payer: 59 | Attending: Psychology | Admitting: Psychology

## 2021-01-21 DIAGNOSIS — G300 Alzheimer's disease with early onset: Secondary | ICD-10-CM

## 2021-01-21 DIAGNOSIS — F0280 Dementia in other diseases classified elsewhere without behavioral disturbance: Secondary | ICD-10-CM | POA: Insufficient documentation

## 2021-01-21 NOTE — Progress Notes (Addendum)
Neuropsychology Note  Thorin Starner completed 240 minutes of neuropsychological testing with this provider. The patient did not appear overtly distressed by the testing session, per behavioral observation or via self-report. Rest breaks were offered.   Clinical Decision Making: Repeat testing battery from 09/29/2019.Changes were made as deemed necessary based on patient performance on testing, my observations, and additional pertinent factors such as those listed above.  Tests Administered: [place] (BNT) Clock Draw [place] Executive Functioning System (D-KEFS), Select subtests Wechsler Adult Intelligence Scale, 4th Edition (WAIS-IV) Wechsler Memory Scale, 4th Edition (WMS-IV); Adult Battery   Results:  TEST SCORES:  Note: This summary of test scores accompanies the interpretive report and should not be considered in isolation without reference to the appropriate sections in the text. Descriptors are based on appropriate normative data and may be adjusted based on clinical judgment. The terms "impaired" and "within normal limits (WNL)" are used when a more specific level of functioning cannot be determined.  Validity Testing: 2021 2022  Descriptor         WAIS-IV Reliable Digit Span: --- --- Within Expectation         Intellectual Functioning:             Wechsler Adult Intelligence Scale (WAIS-IV):  Standard Score/ Scaled Score  Standard Score/ Scaled Score     Full Scale IQ  88 (21) 75 (5) Well Below Average  GAI 97 (42) 81 (10) Below Average  Verbal Comprehension Index: 102 (55) 89 (23) Below Average  Similarities  10 (50) 8 (25) Average  Vocabulary 11 (63) 9 (37) Average  Information  10 (50) 7 (16) Below Average  Perceptual Reasoning Index:  92 (30) 79 (8) Well Below Average  Block Design  7 (16) 7 (16) Below Average  Matrix Reasoning  6 (9) 5 (5) Well Below Average  Visual Puzzles 13 (84) 7 (16) Below Average  Working Memory Index: 86 (18) 71 (3) Well Below  Average  Digit Span 10 (50) 7 (16) Below Average  Arithmetic  5 (5) 3 (1) Exceptionally Low  Processing Speed Index: 76 (5) 74 (4) Well Below Average  Symbol Search  6 (9) 6 (9) Below Average  Coding 5 (5) 4 (2) Well Below Average         Memory:              Wechsler Memory Scale (WMS-IV):                       Raw Score (Scaled Score, Percentile) Raw Score (Scaled Score, Percentile)    Logical Memory I 15/50 (5, 5) 10/50 (3, 1) Exceptionally Low  Logical Memory II 11/50 (6, 9) 3/50 (2, <1) Exceptionally Low  Logical Memory Recognition 23/30 (26-50) 19/30 (3-9) Well Below Average          Verbal Paired Associates I 10/56 (4, 2) 5/56 ( ) Exceptionally Low   Verbal Paired Associates II 2/14 (3, 1) 2/14 (3, 1) Exceptionally Low  Verbal Paired Associates Recognition 34/40 (10-16) 32/40 Exceptionally Low       Visual Reproduction I 34/43 (10, 50) 25/43 (6, 9) Below Average  Visual Reproduction II 21/43 (10, 50) 19/43 (9, 37) Average  Visual Reproduction Recognition 7/7 (>75) 3/7 (10-16) Below Average         Designs I  49/120 (6, 9) ---   ---  Designs II 39/120 (7, 16)  --- ---  Designs Recognition 13/24 (26-50) 14/24 (26-50) Average  Attention/Executive Function:                Scaled Score (Percentile) Scaled Score (Percentile)    WAIS-IV Coding: 5 (5) 4 (2) Well Below Average           Scaled Score  (Percentile) Scaled Score (Percentile)    WAIS-IV Digit Span: 10 (50) 7 (16) Below Average  Forward 12 (75) 14 (91) Above Average  Backward 10 (50) 7 (16) Below Average  Sequencing 7 (16) 3 (1) Exceptionally Low                Wechsler Memory Scale (WMS-IV)  Raw Score (Scaled Score, Percentile) Raw Score (Scaled Score, Percentile)   Spatial Addition  7 (7, 16) 5 ( )    Symbol Span: 10 (5, 5) 3 (2, <1) Exceptionally Low         Scaled Score Scaled Score    WAIS-IV Similarities: 10 (50) 8 (25) Average         D-KEFS Trail Making Test: Raw Score (Scaled Score,  Percentile) Raw Score (Scaled Score, Percentile)    Visual Scanning 25" (11, 63) 27" (10, 50) Average  Number Sequencing 48" (10, 50) 119" (1, <1) Exceptionally Low  Letter Sequencing 70" (7, 16) 87" (4, 2) Well Below Average  Number-Letter Sequencing  170" (6, 9) " 240" 6 errors (1, <1) Exceptionally Low  Motor Speed 27" (12, 75) 32" (11, 63) Average         D-KEFS Color-Word Interference Test: Raw Score (Scaled Score, Percentile) Raw Score (Scaled Score, [place])    Color Naming 45" (5, 5) 47" (4, 2) Well Below Average  Word Reading 25" (10, 50) 31" 7, 16) Below Average  Inhibition 148" (1, <1) 251" (1, <1) Exceptionally Low  Total Errors 9 (2, <1) 19 errors (1, <1) Exceptionally Low  Inhibition/Switching 90" (8, 25) 130" (2, <1) Exceptionally Low  Total Errors 3 (10, 50) 7 errors (6, 9) Below Average         D-KEFS Verbal Fluency Test: Raw Score (Scaled Score, Percentile) Raw Score (Scaled Score, Percentile)    Letter Total Correct 33 (9, 37) 20 (5, 5) Well Below Average  Category Total Correct 33 (9, 37) 24 (6, 9) Below Average  Category Switching Total Correct 8 (5, 5) 7 (3, 1) Exceptionally Low  Category Switching Accuracy 7 (6, 9) 6 (5, 5) Well Below Average  Total Set Loss Errors 0 1 (11, 63) Average  Total Repetition Errors 0 2 (11, 63) Average         D-KEFS Design Fluency Test (2021) Scaled Score (Percentile)     Condition 1 - Filled Dots 12 (75)  --- Above Average  Condition 2 - Empty Dots 11 (63) --- Average  Condition 3 - Switching 6 (9) --- Below Average  Total Set Loss Errors 12 (75) --- Above Average  Total Repetition Errors 9 (37) --- Average         Language:              Verbal Fluency Test: Raw Score (T-Score, Percentile) Raw Score (T-Score, Percentile)    Phonemic Fluency (FAS) 33 (42, 21) 20 (31, 3) Well Below Average  Animal Fluency 20 (51, 54) 12 (34, 5) Well Below Average           Raw Score (T-Score, Percentile) Raw Score (T-Score, Percentile)     [place] Naming Test (BNT): 57/60 (53, 62) 55/60 (45, 31) Average  Visuospatial/Visuoconstruction:              Clock Drawing: --- --- Below Expectation           Scaled Score (Percentile) Scaled Score (Percentile)    WAIS-IV Block Design: 7 (16)  7 (16) Below Average  WAIS-IV Matrix Reasoning: 6 (9) 5 (5) Well Below Average  WAIS-IV Visual Puzzles: 13 (84) 7 (16) Below Average           Feedback to Patient: Abdulah Iqbal will return on 04/14/21 (or sooner) for an interactive feedback session with this provider at which time his test performances, clinical impressions and treatment recommendations will be reviewed in detail. The patient understands he can contact our office should he require our assistance before this time.  240 minutes spent face-to-face with patient administering standardized tests, 35 minutes spent scoring (psychologist). [CPT [id number], [id number]]  Full report to follow.

## 2021-02-11 ENCOUNTER — Ambulatory Visit: Payer: 59 | Admitting: Psychology

## 2021-03-15 ENCOUNTER — Other Ambulatory Visit: Payer: Self-pay | Admitting: Neurology

## 2021-03-15 MED ORDER — AMPHETAMINE-DEXTROAMPHETAMINE 15 MG PO TABS: ORAL_TABLET | ORAL | 0 refills | Status: DC

## 2021-03-15 NOTE — Telephone Encounter (Signed)
Pts wife called requesting refill for amphetamine-dextroamphetamine (ADDERALL) 15 MG tablet. Pharmacy [facility] [phone].

## 2021-04-12 ENCOUNTER — Other Ambulatory Visit: Payer: Self-pay

## 2021-04-12 ENCOUNTER — Encounter: Payer: 59 | Attending: Psychology | Admitting: Psychology

## 2021-04-12 DIAGNOSIS — F0280 Dementia in other diseases classified elsewhere without behavioral disturbance: Secondary | ICD-10-CM | POA: Diagnosis present

## 2021-04-12 DIAGNOSIS — R413 Other amnesia: Secondary | ICD-10-CM | POA: Insufficient documentation

## 2021-04-12 DIAGNOSIS — G300 Alzheimer's disease with early onset: Secondary | ICD-10-CM | POA: Diagnosis present

## 2021-04-12 DIAGNOSIS — R4189 Other symptoms and signs involving cognitive functions and awareness: Secondary | ICD-10-CM | POA: Diagnosis present

## 2021-04-12 DIAGNOSIS — G4733 Obstructive sleep apnea (adult) (pediatric): Secondary | ICD-10-CM | POA: Insufficient documentation
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
## 2021-04-14 ENCOUNTER — Encounter: Payer: 59 | Admitting: Psychology

## 2021-04-19 ENCOUNTER — Other Ambulatory Visit: Payer: Self-pay | Admitting: Neurology

## 2021-04-19 MED ORDER — AMPHETAMINE-DEXTROAMPHETAMINE 15 MG PO TABS: ORAL_TABLET | ORAL | 0 refills | Status: DC

## 2021-04-19 NOTE — Telephone Encounter (Signed)
Pt is needing a refill of his amphetamine-dextroamphetamine (ADDERALL) 15 MG tablet sent in to the [facility]

## 2021-04-19 NOTE — Telephone Encounter (Signed)
I have routed this request to Dr Sater for review. The pt is due for the medication and [place] registry was verified.  

## 2021-06-07 ENCOUNTER — Encounter: Payer: 59 | Attending: Psychology | Admitting: Psychology

## 2021-06-07 ENCOUNTER — Other Ambulatory Visit: Payer: Self-pay

## 2021-06-07 DIAGNOSIS — G4733 Obstructive sleep apnea (adult) (pediatric): Secondary | ICD-10-CM | POA: Diagnosis not present

## 2021-06-07 DIAGNOSIS — F0280 Dementia in other diseases classified elsewhere without behavioral disturbance: Secondary | ICD-10-CM | POA: Diagnosis not present

## 2021-06-07 DIAGNOSIS — G300 Alzheimer's disease with early onset: Secondary | ICD-10-CM | POA: Diagnosis not present

## 2021-06-07 DIAGNOSIS — R413 Other amnesia: Secondary | ICD-10-CM

## 2021-06-09 ENCOUNTER — Encounter: Payer: Self-pay | Admitting: Psychology

## 2021-06-09 NOTE — Progress Notes (Signed)
06/07/2021, 4 PM-5 PM: Today's visit was an in person visit that was conducted in my outpatient clinic office with the patient myself present.  Today we reviewed the results of the recent neuropsychological evaluation that is a repeat evaluation with comparison is made to previous testing.  The patient's complete formal evaluation can be found in his EMR dated 04/12/2021 and have a included the impression/results below for convenience.  Since this evaluation was completed the patient is now had the inspire device implanted for his obstructive sleep apnea and he reports that he has had significant improvements in overall functioning but continues to have memory, language and visual-spatial/geographic disorient changes.  While it is possible that some of the severity and difficulties that he is having and has been having have at least been exacerbated by significant sleep apnea and effective treatment has improved he does continue to have symptoms.  The patient has been instructed that if he continues to see significant improvement that a reassessment of diagnostic considerations would be appropriate in the future.  The patient was instructed that in approximately 9 months that if he is seeing any further progression or significant improvement had a reassessment would be appropriate.    Impression/Diagnosis:                     Overall, the results of the current neuropsychological evaluation were direct comparisons were made between the initial neuropsychological evaluation in 2021 and the most recent results this year show a significant progressive decline cognitive functions.  There is a significant decline from already impaired performance over the past 9 to 12 months.  While during the clinical interview the patient reports that he feels like he has improved some since the initial testing and has been working on treating his moderate to severe sleep apnea and reducing his use of benzodiazepine and while  increasing or more consistently using psychostimulant medications, he continues to report difficulties with geographic orientation, continued difficulties with memory although visual memory can come back when he steps back from the situation.  The patient reports that he is having to take much more time to think through issues and continues to struggle with mathematical another executive functioning patterns.  Current assessment as noted below suggest further progression in a wide range of cognitive domains including global cognitive functioning, expressive language functioning decline with significant deficits, further decline and significant deficits particularly for auditory learning and memory with decline in his previous well-preserved visual memory and learning.  Encoding, storage and organization, retrieval and retention after period of delay were all noted in his significant memory deficits.  Significant executive functioning deficits were also noted for both verbal and visual modalities.  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  Even though there is a strong family history of Parkinson's disease there continue to be no significant evidence of Parkinson's dementia.  There is also only mild indications of microvascular ischemic changes and no evidence of stroke or cerebral hemorrhage.  Neuropsychological testing continue to strongly suggest involvement of parietal, temporal and frontal lobe brain regions.  While the patient's moderate to severe sleep apnea is and difficulty treating up to this point with BiPAP machine can produce significant memory and cognitive impacts the pattern and nature and progression of his cognitive decline is seen through both subjective reports and objective assessment would go well beyond those typically seen with untreated sleep  apnea.  The patient has now had the  inspire hypoglossal stimulator device implanted but we do not know yet how much this is helped his overall status.  As far as treatment recommendations, the patient is showing progression and further decline in overall cognitive functioning.  There will be increasing concerns about his continuation of driving as noted cognitive deficits and further decline are noted for visual spatial and visual judgment capacity, visual searching and speed of mental operations, and other executive functioning declines.  I will also sit down with the patient and his family and go over future planning and adjustments that can be made as well as making the information available to his neurologist as far as potential medication strategies.  The primary medication he is taking at this point is Adderall to address his sleepiness and fatigue and reports that he has been reducing the amount of benzo diazepam he has been taking.   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 Clinical Neuropsychologist

## 2021-06-16 ENCOUNTER — Other Ambulatory Visit: Payer: Self-pay | Admitting: Neurology

## 2021-06-16 MED ORDER — AMPHETAMINE-DEXTROAMPHETAMINE 15 MG PO TABS: ORAL_TABLET | ORAL | 0 refills | Status: DC

## 2021-06-16 NOTE — Telephone Encounter (Signed)
Pt's wife, Chadwick Reiswig request refill for amphetamine-dextroamphetamine (ADDERALL) 15 MG tablet at [facility] [phone]

## 2021-07-18 ENCOUNTER — Other Ambulatory Visit: Payer: Self-pay | Admitting: Neurology

## 2021-07-18 MED ORDER — AMPHETAMINE-DEXTROAMPHETAMINE 15 MG PO TABS: ORAL_TABLET | ORAL | 0 refills | Status: DC

## 2021-07-18 NOTE — Telephone Encounter (Signed)
Received refill request for Adderall.  Last OV was on 01/17/21.  Next OV is scheduled for 07/26/21 .  Last RX was written on 06/17/21 for 60 tabs.   [place] [facility] has been reviewed.

## 2021-07-18 NOTE — Telephone Encounter (Signed)
Pt's wife, Chad Tran request refill for amphetamine-dextroamphetamine (ADDERALL) 15 MG tablet at [facility] [phone]

## 2021-07-26 ENCOUNTER — Encounter: Payer: Self-pay | Admitting: Neurology

## 2021-07-26 ENCOUNTER — Telehealth: Payer: Self-pay | Admitting: *Deleted

## 2021-07-26 ENCOUNTER — Other Ambulatory Visit: Payer: Self-pay

## 2021-07-26 ENCOUNTER — Ambulatory Visit (INDEPENDENT_AMBULATORY_CARE_PROVIDER_SITE_OTHER): Payer: 59 | Admitting: Neurology

## 2021-07-26 VITALS — BP 157/91 | HR 67 | Ht 70.0 in | Wt 219.0 lb

## 2021-07-26 DIAGNOSIS — G309 Alzheimer's disease, unspecified: Secondary | ICD-10-CM | POA: Insufficient documentation

## 2021-07-26 DIAGNOSIS — G4733 Obstructive sleep apnea (adult) (pediatric): Secondary | ICD-10-CM | POA: Diagnosis not present

## 2021-07-26 DIAGNOSIS — G319 Degenerative disease of nervous system, unspecified: Secondary | ICD-10-CM | POA: Diagnosis not present

## 2021-07-26 DIAGNOSIS — R413 Other amnesia: Secondary | ICD-10-CM | POA: Diagnosis not present

## 2021-07-26 MED ORDER — AMPHETAMINE-DEXTROAMPHETAMINE 20 MG PO TABS: 20.0000 mg | ORAL_TABLET | Freq: Two times a day (BID) | ORAL | 0 refills | Status: DC

## 2021-07-26 MED ORDER — DONEPEZIL HCL 5 MG PO TABS: ORAL_TABLET | ORAL | 0 refills | Status: DC

## 2021-07-26 NOTE — Telephone Encounter (Signed)
Faxed completed/signed [facility] lab order to [phone]. Received fax confirmation. Test ordered: 109, 179 ([facility] ApoE genotype analysis & interpretation (symptomatic for dementia) and [facility] early-onset Alzheimer's evaluation)

## 2021-07-26 NOTE — Progress Notes (Signed)
[facility]  PATIENT: Chad Tran DOB: 22-May-1961  REFERRING DOCTOR OR PCP:  Benedetto Goad, MD; Rueben Bash SOURCE: Patient, notes from primary care, imaging and laboratory reports, MRI of the brain personally reviewed.  _________________________________   HISTORICAL  CHIEF COMPLAINT:  Chief Complaint  Patient presents with   Follow-up    Pt with wife, rm 1. Overall doing well. He wanted to discuss Adderall potentially changing.   Update 07/26/2021: He feels his cognition is doing a little better than earlier this year.   However, formal testing showed some progression from 2021 to 2022.   I reviewed the results of his recent neuro cognitive testing with him and his wife.  They are consistent with Alzheimer's disease.  Presenting with symptoms before age 94 is unusual is more likely to occur with a genetic etiology.  I discussed with him and his wife about getting genetic testing.  He has difficulty with focus and attention.   He notes that he was doing well since starting Adderall.   He felt the brand did better than the generic.   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    He notes sentence construction is occasionally poor.   He has had difficutly telling time on an analog clock.   He feels cognition is worse when tired.  Adderall has helped cognition and ADD.   He is scheduled to have neurocognitive evaluation in early January.  Physically he is doing well and feels better since the inspire device with less fatigue.    He tries to eat well and enjoys a [employer] and eats vegetables and low salt.       He has severe OSA (AHI = 39.7)  and was using BiPAP nightly but had trouble tolerating it.  Thereofre, he switched to the [place] device recently.  He feels less sleepy now.    He saw Dr. Vella Kohler and then Dr, Kieth Brightly for neurocognitive testing and 2022  resuts showed progression compred to 2021   Impression/Diagnosis:                     Overall, the results of the current neuropsychological evaluation were direct comparisons were made between the initial neuropsychological evaluation in 2021 and the most recent results this year show a significant progressive decline cognitive functions.  There is a significant decline from already impaired performance over the past 9 to 12 months.  While during the clinical interview the patient reports that he feels like he has improved some since the initial testing and has been working on treating his moderate to severe sleep apnea and reducing his use of benzodiazepine and while increasing or more consistently using psychostimulant medications, he continues to report difficulties with geographic orientation, continued difficulties with memory although visual memory can come back when he steps back from the situation.  The patient reports that he is having to take much more time to think through issues and continues to struggle with mathematical another executive functioning patterns.  Current assessment as noted below suggest further progression in a wide range of cognitive domains including global cognitive functioning, expressive language functioning decline with significant deficits, further decline and significant deficits particularly for auditory learning and memory with decline in his previous well-preserved visual memory and learning.  Encoding, storage and organization, retrieval and retention after period of delay were all noted in his  significant memory deficits.  Significant executive functioning deficits were also noted for both verbal and visual modalities.  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  Even though there is a strong family history of Parkinson's disease there continue to be no  significant evidence of Parkinson's dementia.  There is also only mild indications of microvascular ischemic changes and no evidence of stroke or cerebral hemorrhage.  Neuropsychological testing continue to strongly suggest involvement of parietal, temporal and frontal lobe brain regions.  While the patient's moderate to severe sleep apnea is and difficulty treating up to this point with BiPAP machine can produce significant memory and cognitive impacts the pattern and nature and progression of his cognitive decline is seen through both subjective reports and objective assessment would go well beyond those typically seen with untreated sleep apnea.  The patient has now had the inspire hypoglossal stimulator device implanted but we do not know yet how much this is helped his overall status.  As far as treatment recommendations, the patient is showing progression and further decline in overall cognitive functioning.  There will be increasing concerns about his continuation of driving as noted cognitive deficits and further decline are noted for visual spatial and visual judgment capacity, visual searching and speed of mental operations, and other executive functioning declines.  I will also sit down with the patient and his family and go over future planning and adjustments that can be made as well as making the information available to his neurologist as far as potential medication strategies.  The primary medication he is taking at this point is Adderall to address his sleepiness and fatigue and reports that he has been reducing the amount of benzo diazepam he has been taking.    HISTORY OF COGNITIVE ISSUES:  I first saw him 04/22/2019.  At the time he presented with processing and attentional issues for the previous 6-12 months.   He noted planning and executing tasks were more difficult.  He has noted that he has had difficulty telling time on an analog clock.      He notes some difficulty with memory but  has had more problems with processing the memory.  He is driving but feels he needs to pay much more attention than he used to.  He has made some errors in driving form point to point but has never been completely lost.   He has tinnitus and has often needed to focus more during conversatoin.   This is a little worse.      He has had problems coming up with the right words. He writes for fun and this is much more difficult.    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   He used to be good at math and now has difficulty.   He can't estimate like he used to and is reliant on a a calculator.      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   He needs to work > 40 hours many weeks.     He has more trouble estimating jobs.    MRI of the brain 03/28/2019 was interpreted as showing mild chronic microvascular ischemic change and was otherwise normal.  However, I personally reviewed the MRI of the brain dated 03/28/2019 and feel that there is very significant atrophy, much more than expected for age especially in  the parietal lobes, to lesser extent in the occipital and frontal lobes.  The medial temporal lobes did not show any atrophy.  There is also mild chronic microvascular ischemic change, a little more than expected for age.  I also reviewed recent lab work.  B12 and TSH and other labs are normal.  Cognitive testing by neuropsychology in 2021 and 2022 was consistent with Alzheimer's disease.  Vascular risk factors: Hypertension.  Montreal Cognitive Assessment  04/22/2019  Visuospatial/ Executive (0/5) 3  Naming (0/3) 3  Attention: Read list of digits (0/2) 2  Attention: Read list of letters (0/1) 1  Attention: Serial 7 subtraction starting at 100 (0/3) 1  Language: Repeat phrase (0/2) 2  Language : Fluency (0/1) 0  Abstraction (0/2) 2  Delayed Recall (0/5) 0  Orientation (0/6) 6  Total 20  Adjusted Score (based on education) 20     REVIEW OF  SYSTEMS: Constitutional: No fevers, chills, sweats, or change in appetite Eyes: No visual changes, double vision, eye pain Ear, nose and throat: No hearing loss, ear pain, nasal congestion, sore throat Cardiovascular: No chest pain, palpitations Respiratory:  No shortness of breath at rest or with exertion.   No wheezes.  His wife notes severe snoring and pauses in his breathing at night. GastrointestinaI: No nausea, vomiting, diarrhea, abdominal pain, fecal incontinence Genitourinary:  No dysuria, urinary retention or frequency.  No nocturia. Musculoskeletal:  No neck pain, back pain Integumentary: No rash, pruritus, skin lesions Neurological: as above Psychiatric: No depression at this time.  No anxiety Endocrine: No palpitations, diaphoresis, change in appetite, change in weigh or increased thirst Hematologic/Lymphatic:  No anemia, purpura, petechiae. Allergic/Immunologic: No itchy/runny eyes, nasal congestion, recent allergic reactions, rashes  ALLERGIES: No Known Allergies  HOME MEDICATIONS:  Current Outpatient Medications:    ALPRAZolam (XANAX) 0.25 MG tablet, Take 1 tablet by mouth 3 (three) times daily., Disp: , Rfl:    amphetamine-dextroamphetamine (ADDERALL) 20 MG tablet, Take 1 tablet (20 mg total) by mouth 2 (two) times daily., Disp: 60 tablet, Rfl: 0   atorvastatin (LIPITOR) 80 MG tablet, Take 80 mg by mouth daily., Disp: , Rfl:    donepezil (ARICEPT) 5 MG tablet, Take 1/2 to 1 pill at night, Disp: 30 tablet, Rfl: 0   loteprednol (LOTEMAX) 0.5 % ophthalmic suspension, Place 1 drop into the left eye daily., Disp: , Rfl:    Multiple Vitamin tablet, Take by mouth., Disp: , Rfl:    ramipril (ALTACE) 5 MG capsule, Take 5 mg by mouth daily., Disp: , Rfl:    tadalafil (CIALIS) 20 MG tablet, Take 10 mg by mouth as needed., Disp: , Rfl:   PAST MEDICAL HISTORY: Past Medical History:  Diagnosis Date   Anxiety    BPH (benign prostatic hyperplasia)    Decreased libido     Difficulty concentrating    Erectile dysfunction    GERD (gastroesophageal reflux disease)    Hyperlipidemia    Hypertension    Lipoma of back    Senile nuclear sclerosis     PAST SURGICAL HISTORY: Past Surgical History:  Procedure Laterality Date   arm surgery Left    COLONOSCOPY     CORNEAL TRANSPLANT Bilateral    CYST REMOVAL HAND     chest and back as well   HERNIA REPAIR Bilateral     FAMILY HISTORY: Family History  Problem Relation Age of Onset   Lung cancer Father     SOCIAL HISTORY:  Social History   Socioeconomic History  Marital status: Married    Spouse name: Not on file   Number of children: Not on file   Years of education: Not on file   Highest education level: Not on file  Occupational History   Not on file  Tobacco Use   Smoking status: Former   Smokeless tobacco: Never  Substance and Sexual Activity   Alcohol use: Yes    Comment: weekends   Drug use: Never   Sexual activity: Not on file  Other Topics Concern   Not on file  Social History Narrative   Lives with wife   Caffeine use: 2 cups coffee per day, tea at night   Right handed    Social Determinants of Health   Financial Resource Strain: Not on file  Food Insecurity: Not on file  Transportation Needs: Not on file  Physical Activity: Not on file  Stress: Not on file  Social Connections: Not on file  Intimate Partner Violence: Not on file     PHYSICAL EXAM  Vitals:   07/26/21 1535  BP: (!) 157/91  Pulse: 67  Weight: 219 lb (99.3 kg)  Height: $Remove'5\' 10"'xoSQNRV$  (1.778 m)    Body mass index is 31.42 kg/m.   General: The patient is well-developed and well-nourished and in no acute distress  HEENT:  Head is [place]/AT.  Sclera are anicteric.    Skin: Extremities are without rash or  edema.  Neurologic Exam  Mental status: The patient is alert and oriented x 3 at the time of the examination.  He had reduced focus/attention.  Speech was fluent.    Cranial nerves: Extraocular  movements are full OD.  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  The right pupil is postoperative and he has reduced right vision. Facial symmetry is present. There is good facial sensation to soft touch bilaterally.Facial strength is normal.  Trapezius and sternocleidomastoid strength is normal. No dysarthria is noted.  No obvious hearing deficits are noted.  Motor:  Muscle bulk is normal.   Tone is normal. Strength is  5 / 5 in all 4 extremities.   Sensory: Sensory testing is intact to pinprick, soft touch and vibration sensation in all 4 extremities.  Coordination: Cerebellar testing reveals good finger-nose-finger and heel-to-shin bilaterally.  Gait and station: Station is normal.  The gait is normal.  Tandem gait is mildly wide.  Romberg is negative.   Reflexes: Deep tendon reflexes are symmetric and normal bilaterally.        DIAGNOSTIC DATA (LABS, IMAGING, TESTING) - I reviewed patient records, labs, notes, testing and imaging myself where available.  Lab Results  Component Value Date   WBC 7.5 03/25/2008   HGB 15.7 03/25/2008   HCT 47.3 03/25/2008   MCV 95.4 03/25/2008   PLT 252 03/25/2008      Component Value Date/Time   NA 138 03/25/2008 0913   K 5.1 MARKED HEMOLYSIS 03/25/2008 0913   CL 107 03/25/2008 0913   CO2 24 03/25/2008 0913   GLUCOSE 92 03/25/2008 0913   BUN 18 03/25/2008 0913   CREATININE 0.70 03/25/2008 0913   CALCIUM 9.9 03/25/2008 0913   GFRNONAA >60 03/25/2008 0913   GFRAA  03/25/2008 0913    >60        The eGFR has been calculated using the MDRD equation. This calculation has not been validated in all clinical       ASSESSMENT AND PLAN    1. Alzheimer's disease, unspecified (CODE) ([place])   2. Brain atrophy ([place])   3. Memory  loss   4. OSA (obstructive sleep apnea)       1.  He has atrophy that is predominantly in the parietal lobes and to a lesser extent occipital lobes with very little atrophy in the frontal and temporal lobes.  However, neurocognitive  testing was consistent with Alzheimer's disease.  I discussed with him and his wife that that is the probable diagnosis.  Therefore, I would like him to start donepezil and I would consider adding memantine at the next visit 2.   We discussed getting another MRI to compare to the 2020 MRI to better determine the etiology.  He has the inspire device.  It is MRI compatible as long as it is turned off and he scans within a 1.5 Tesla magnet.  I was able to confirm this online.  Additionally, I will set up [facility] genetic testing for the early onset Alzheimer's 3.   Change prescription for Adderall to 20 mg bid.   4.   Stay active and exercise as tolerated. 5.. Return in 6 months or sooner for new or worsening neurologic symptoms.  44-minute office visit with the majority of the time spent face-to-face for history and physical, discussion/counseling and decision-making.  Additional time with record review and documentation.   Madelyne Millikan A. Felecia Shelling, MD, PhD, FAAN Certified in Neurology, Clinical Neurophysiology, Sleep Medicine, Pain Medicine and Neuroimaging Director, [place] at [facility] [address] [place], [place] [address] [phone]

## 2021-08-08 NOTE — Telephone Encounter (Signed)
Called [facility] and spoke w/ [facility]. They did not receive order. I re-faxed to them at [phone]. Received fax confirmation.

## 2021-08-10 ENCOUNTER — Other Ambulatory Visit: Payer: 59

## 2021-08-10 ENCOUNTER — Telehealth: Payer: Self-pay | Admitting: Neurology

## 2021-08-10 DIAGNOSIS — G309 Alzheimer's disease, unspecified: Secondary | ICD-10-CM

## 2021-08-10 NOTE — Telephone Encounter (Signed)
Patient has the inspire and it is not MRI compatible. The patient lwife eft me a voicemail stating they would need to be scheduled at the hospital. When you get a chance can you put a new MRI order in and I will see if the patient can go to the hospital. Thank you!

## 2021-08-10 NOTE — Telephone Encounter (Signed)
Noted, thank you I sent the order to [facility] they will reach out to the patient to schedule.  Novella Rob: [id number] (exp. 01/28/22)

## 2021-08-22 ENCOUNTER — Encounter (HOSPITAL_COMMUNITY): Payer: Self-pay

## 2021-08-22 ENCOUNTER — Other Ambulatory Visit: Payer: Self-pay

## 2021-08-22 ENCOUNTER — Other Ambulatory Visit: Payer: Self-pay | Admitting: Neurology

## 2021-08-22 ENCOUNTER — Ambulatory Visit (HOSPITAL_COMMUNITY)
Admission: RE | Admit: 2021-08-22 | Discharge: 2021-08-22 | Disposition: A | Discharge status: Home / Self Care | Payer: 59 | Source: Ambulatory Visit | Attending: Neurology | Admitting: Neurology

## 2021-08-22 DIAGNOSIS — G309 Alzheimer's disease, unspecified: Secondary | ICD-10-CM

## 2021-08-22 MED ORDER — AMPHETAMINE-DEXTROAMPHETAMINE 20 MG PO TABS
20.0000 mg | ORAL_TABLET | Freq: Two times a day (BID) | ORAL | 0 refills | Status: DC
Start: 2021-08-22 — End: 2021-10-04

## 2021-08-22 NOTE — Telephone Encounter (Signed)
Pt's wife Joelene Millin called requesting refill for amphetamine-dextroamphetamine (ADDERALL) 20 MG tablet. Pharmacy [facility] [id number] - [place], [place]

## 2021-08-25 ENCOUNTER — Telehealth: Payer: Self-pay | Admitting: Neurology

## 2021-08-25 MED ORDER — DONEPEZIL HCL 5 MG PO TABS: ORAL_TABLET | ORAL | 0 refills | Status: DC

## 2021-08-25 NOTE — Telephone Encounter (Signed)
Pt's wife requesting refill for donepezil (ARICEPT) 5 MG tablet. Pharmacy [facility] [id number] - [place], [place]

## 2021-08-25 NOTE — Telephone Encounter (Signed)
E-scribed refill as requested. 

## 2021-08-31 NOTE — Telephone Encounter (Signed)
Received fax update on 08/24/21 from [facility]: "Thank you for faxing your order to [facility] for diagnostic testing for the above mentioned patient through the home draw program. This is for your info only. You do not need to response to this fax. We will forward the appropriate phlebotomy kit to the patient's home and arrange for the blood draw. Test results will be forwarded to your office as soon as they are available."

## 2021-09-01 ENCOUNTER — Other Ambulatory Visit: Payer: Self-pay

## 2021-09-01 ENCOUNTER — Ambulatory Visit (HOSPITAL_COMMUNITY)
Admission: RE | Admit: 2021-09-01 | Discharge: 2021-09-01 | Disposition: A | Discharge status: Home / Self Care | Payer: 59 | Source: Ambulatory Visit | Attending: Neurology | Admitting: Neurology

## 2021-09-01 DIAGNOSIS — G309 Alzheimer's disease, unspecified: Secondary | ICD-10-CM | POA: Insufficient documentation

## 2021-09-01 IMAGING — MR MR HEAD W/O CM
12 of 13 series · 43 of 48 positions shown · non-contrast
Comparison: [DATE]

CLINICAL DATA: Dementia

EXAM:
MRI HEAD WITHOUT CONTRAST
TECHNIQUE: Multiplanar, multiecho pulse sequences of the brain and surrounding
structures were obtained without intravenous contrast.

[Series 5: DWI · coronal · 4.0mm · 1.15mm/px · 5 of 72 slices shown (1 of 4)]
[im 1/72]
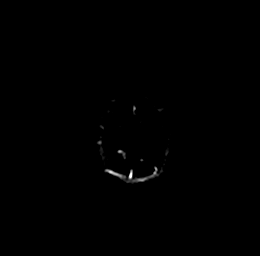
[im 18/72]
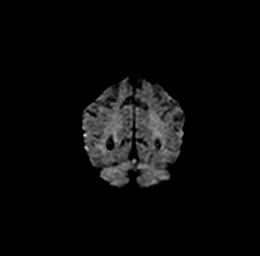
[im 36/72]
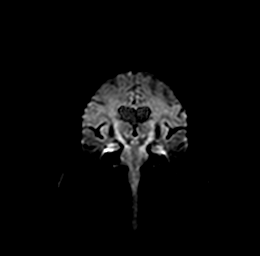
[im 54/72]
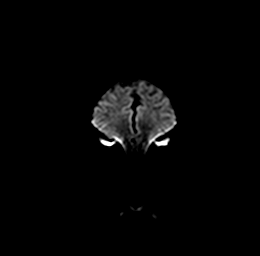
[im 72/72]
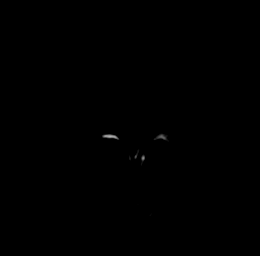

[Series 6: DWI · coronal · 4.0mm · 1.15mm/px · 3 of 36 slices shown (2 of 4)]
[im 1/36]
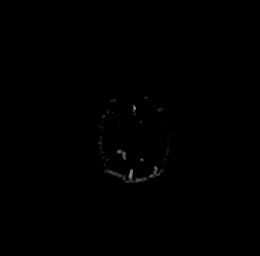
[im 18/36]
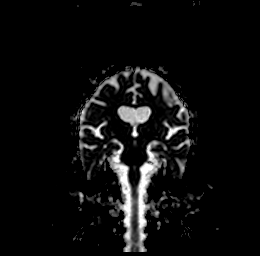
[im 36/36]
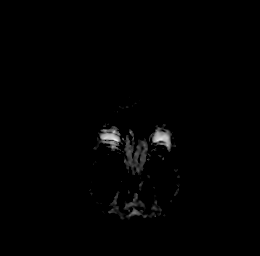

[Series 7: DWI · axial · 3.0mm · 0.92mm/px · z∈[-71,+83]mm · 8 of 105 slices shown (3 of 4)]
[im 1/105]
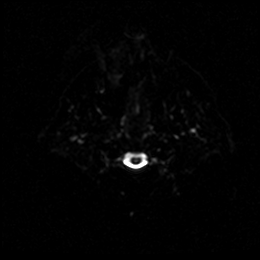
[im 15/105]
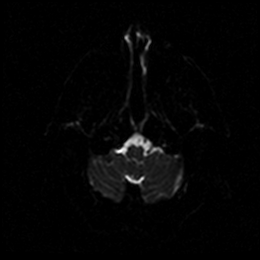
[im 30/105]
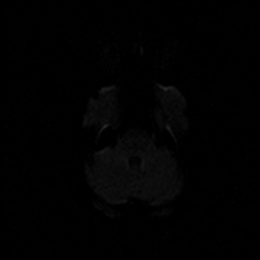
[im 45/105]
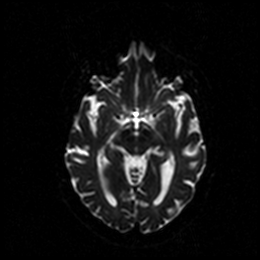
[im 60/105]
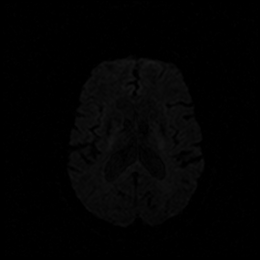
[im 75/105]
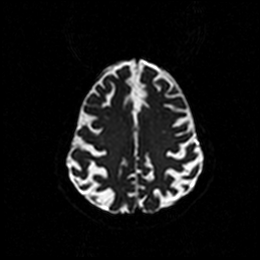
[im 90/105]
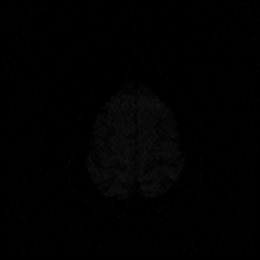
[im 105/105]
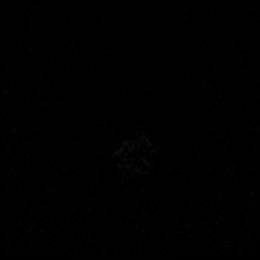

[Series 8: DWI · axial · 3.0mm · 0.92mm/px · z∈[-71,+83]mm · 4 of 54 slices shown (4 of 4)]
[im 1/54]
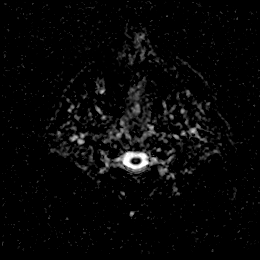
[im 18/54]
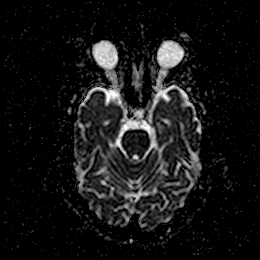
[im 36/54]
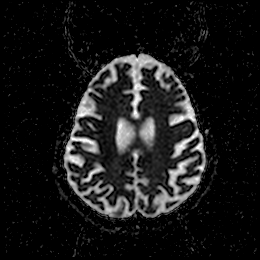
[im 54/54]
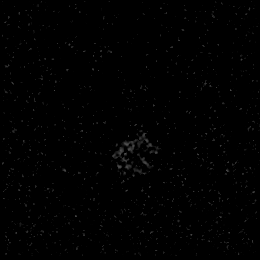

[Series 9: T1 · sagittal · 5.0mm · 0.75mm/px · 2 of 23 slices shown]
[im 1/23]
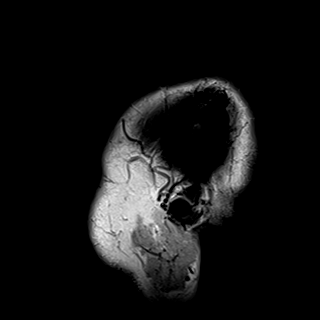
[im 23/23]
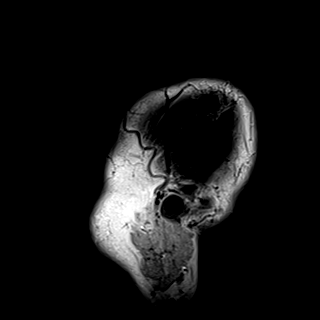

[Series 10: FLAIR · axial · 5.0mm · 0.72mm/px · z∈[-64,+74]mm · 2 of 23 slices shown]
[im 1/23]
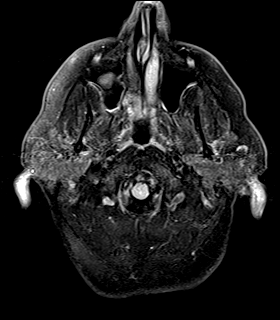
[im 23/23]
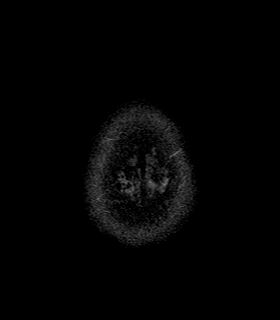

[Series 11: mag_images · axial · 3.0mm · 0.90mm/px · 1 of 16 slices shown]
[im 1/16]
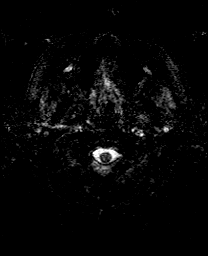

[Series 12: pha_images · axial · 3.0mm · 0.90mm/px · z∈[-80,+87]mm · 4 of 57 slices shown]
[im 1/57]
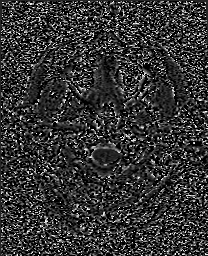
[im 19/57]
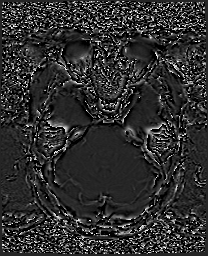
[im 38/57]
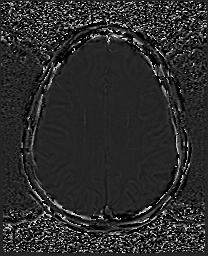
[im 57/57]
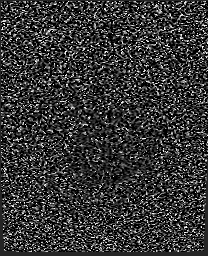

[Series 13: swi_images_nonorm · axial · 3.0mm · 0.90mm/px · z∈[-80,+90]mm · 5 of 60 slices shown]
[im 1/60]
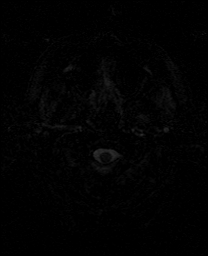
[im 15/60]
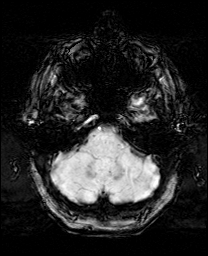
[im 30/60]
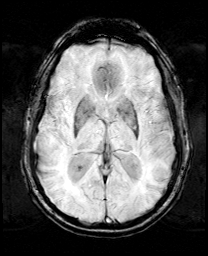
[im 45/60]
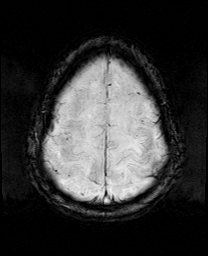
[im 60/60]
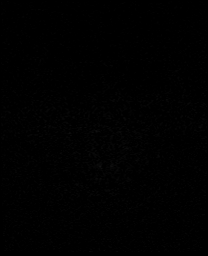

[Series 14: mag_images_nonorm · axial · 3.0mm · 0.90mm/px · z∈[-34,+90]mm · 3 of 44 slices shown]
[im 1/44]
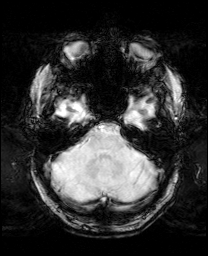
[im 22/44]
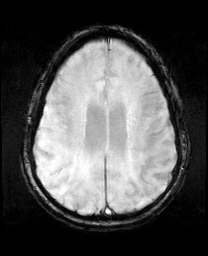
[im 44/44]
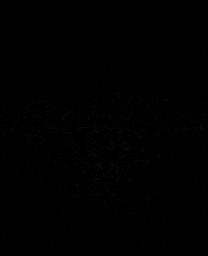

[Series 15: mip_images(sw) · axial · 24.0mm · 0.90mm/px · z∈[-70,+80]mm · 4 of 53 slices shown]
[im 1/53]
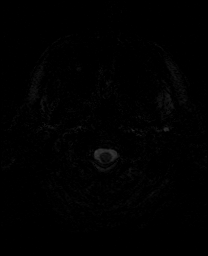
[im 18/53]
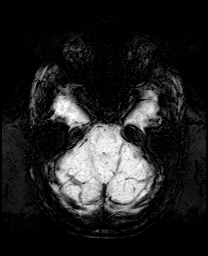
[im 35/53]
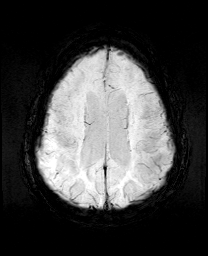
[im 53/53]
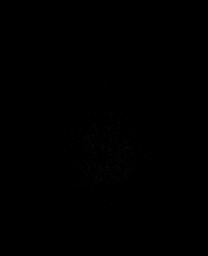

[Series 16: T2 · axial · 5.0mm · 0.72mm/px · z∈[-76,+80]mm · 2 of 26 slices shown]
[im 1/26]
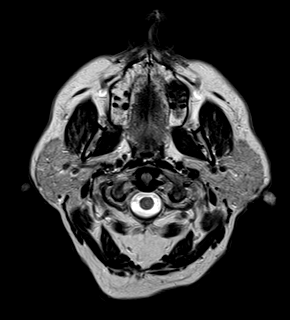
[im 26/26]
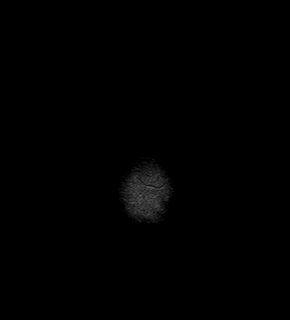

[43 of 48 positions shown; findings below may reference images not displayed]

FINDINGS: Brain: No restricted diffusion to suggest acute or subacute infarct.
No acute hemorrhage, mass, mass effect, or midline shift. T2
hyperintense signal in the periventricular white matter, likely the
sequela of chronic small vessel ischemic disease. Cerebral volume
loss is likely within normal limits for age loss, without lobar
predominance. No foci of hemosiderin deposition to suggest remote
hemorrhage or superficial siderosis.

Vascular: Normal flow voids.

Skull and upper cervical spine: Normal marrow signal.

Sinuses/Orbits: Negative.

Other: The mastoids are well aerated.
IMPRESSION: No acute intracranial process. Degree of cerebral volume loss is
likely within normal limits for age and is without lobar
predominance.

## 2021-09-12 NOTE — Telephone Encounter (Signed)
Faxed letter of medical necessity and office notes to [facility] as requested for coverage of lab tests ordered. Fax: [phone]. Received fax confirmation.

## 2021-09-13 NOTE — Telephone Encounter (Signed)
Called [facility] and spoke w/ Anda Kraft. Confirmed they received LMN and office notes faxed yesterday. She transferred me to billing department, spoke w/ Alyssa. States pt had labs collected on 08/30/21. They have received specimen and its currently being processed.  They bill pt insurance directly. If they request medical records, they will then submit LMN and office notes that we sent. Nothing further needed a this time.

## 2021-09-30 ENCOUNTER — Telehealth: Payer: Self-pay | Admitting: Neurology

## 2021-09-30 NOTE — Telephone Encounter (Signed)
The Apo E genotype was E3/E3 (average risk for Alzheimer's)  The MRI scan done at the end of December showed mild generalized atrophy, no significant change compared to 2002.  We started donepezil 5 mg and I will have him increase it to 10 mg if tolerated.

## 2021-10-03 MED ORDER — DONEPEZIL HCL 10 MG PO TABS: ORAL_TABLET | ORAL | 5 refills | Status: DC

## 2021-10-03 NOTE — Addendum Note (Signed)
Addended by: Wyvonnia Lora on: 10/03/2021 09:17 AM   Modules accepted: Orders

## 2021-10-03 NOTE — Telephone Encounter (Signed)
Called and spoke w/ wife (on [place]). Pt at work and she wanted to take message. I relayed results per Dr. Garth Bigness note. She verbalized understanding. He is tolerating donepezil 5mg  po qhs well. Agreeable to increase dose to 10mg . I e-scribed to [facility]. Next f/u 02/21/22 at 4pm.

## 2021-10-04 ENCOUNTER — Other Ambulatory Visit: Payer: Self-pay | Admitting: Neurology

## 2021-10-04 MED ORDER — AMPHETAMINE-DEXTROAMPHETAMINE 20 MG PO TABS: 20.0000 mg | ORAL_TABLET | Freq: Two times a day (BID) | ORAL | 0 refills | Status: DC

## 2021-10-04 NOTE — Addendum Note (Signed)
Addended by: Wyvonnia Lora on: 10/04/2021 11:48 AM   Modules accepted: Orders

## 2021-10-04 NOTE — Telephone Encounter (Signed)
Patient's wife called in requesting refill

## 2021-11-01 ENCOUNTER — Other Ambulatory Visit: Payer: Self-pay | Admitting: Neurology

## 2021-11-01 MED ORDER — AMPHETAMINE-DEXTROAMPHETAMINE 20 MG PO TABS: 20.0000 mg | ORAL_TABLET | Freq: Two times a day (BID) | ORAL | 0 refills | Status: DC

## 2021-11-01 NOTE — Telephone Encounter (Signed)
Pt is needing a refill request for his amphetamine-dextroamphetamine (ADDERALL) 20 MG tablet sent in to the [facility] in [place]

## 2021-12-21 ENCOUNTER — Other Ambulatory Visit: Payer: Self-pay | Admitting: Neurology

## 2021-12-21 MED ORDER — AMPHETAMINE-DEXTROAMPHETAMINE 20 MG PO TABS: 20.0000 mg | ORAL_TABLET | Freq: Two times a day (BID) | ORAL | 0 refills | Status: DC

## 2021-12-21 NOTE — Telephone Encounter (Signed)
Pt is needing a refill request for his amphetamine-dextroamphetamine (ADDERALL) 20 MG tablet sent to the [facility] on [address]. In [place] ?

## 2022-01-31 ENCOUNTER — Other Ambulatory Visit: Payer: Self-pay | Admitting: Neurology

## 2022-01-31 MED ORDER — AMPHETAMINE-DEXTROAMPHETAMINE 20 MG PO TABS: 20.0000 mg | ORAL_TABLET | Freq: Two times a day (BID) | ORAL | 0 refills | Status: DC

## 2022-01-31 NOTE — Telephone Encounter (Signed)
Pt's wife, Kirkland Hun request refill for amphetamine-dextroamphetamine (ADDERALL) 20 MG tablet at [place] [phone]

## 2022-02-21 ENCOUNTER — Ambulatory Visit: Payer: 59 | Admitting: Neurology

## 2022-02-21 ENCOUNTER — Encounter: Payer: Self-pay | Admitting: Neurology

## 2022-02-21 VITALS — BP 156/103 | HR 58 | Ht 70.0 in | Wt 205.0 lb

## 2022-02-21 DIAGNOSIS — G4733 Obstructive sleep apnea (adult) (pediatric): Secondary | ICD-10-CM | POA: Diagnosis not present

## 2022-02-21 DIAGNOSIS — R4189 Other symptoms and signs involving cognitive functions and awareness: Secondary | ICD-10-CM

## 2022-02-21 DIAGNOSIS — R413 Other amnesia: Secondary | ICD-10-CM | POA: Diagnosis not present

## 2022-02-21 DIAGNOSIS — G309 Alzheimer's disease, unspecified: Secondary | ICD-10-CM | POA: Diagnosis not present

## 2022-02-21 DIAGNOSIS — F988 Other specified behavioral and emotional disorders with onset usually occurring in childhood and adolescence: Secondary | ICD-10-CM

## 2022-02-21 DIAGNOSIS — G319 Degenerative disease of nervous system, unspecified: Secondary | ICD-10-CM | POA: Diagnosis not present

## 2022-02-21 MED ORDER — DONEPEZIL HCL 10 MG PO TABS: ORAL_TABLET | ORAL | 11 refills | Status: DC

## 2022-02-21 MED ORDER — AMPHETAMINE-DEXTROAMPHETAMINE 20 MG PO TABS: 20.0000 mg | ORAL_TABLET | Freq: Two times a day (BID) | ORAL | 0 refills | Status: DC

## 2022-02-21 NOTE — Progress Notes (Signed)
[facility]  PATIENT: Chad Tran DOB: 09/30/1960  REFERRING DOCTOR OR PCP:  Kathryne Eriksson, MD; Clyde Lundborg SOURCE: Patient, notes from primary care, imaging and laboratory reports, MRI of the brain personally reviewed.  _________________________________   HISTORICAL  CHIEF COMPLAINT:  Chief Complaint  Patient presents with   Follow-up    Pt reports not feeling good. Has problems speaking and feels confusing. He feels like he is not on the level he should be. Feels like hes not getting anywhere. Room 1, with wife   Update 02/21/22: Since the last visit, he had another MRI - essentially unchanged compared to 2020 - showing atrophy parietal > frontal / occipital but not much atrophy in temporal lobes.     He and his wife both feel that he has had more cognitive issues than he did last year though the progression is slow.  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  Neurocognitive testing in 2021 and 2022 was abnormal.  There was some progression from 2021 to 2022.   I had reviewed the results of his recent neuro cognitive testing with him and his wife.  They are consistent with Alzheimer's disease.  Presenting with symptoms before age 34 is unusual is more likely to occur with a genetic etiology.  I discussed with him and his wife about getting genetic testing.  Besides memory, he has difficulty with focus and attention.  This does a little bit better with Adderall.  Language continues to be a problem.  He notes sentence construction is occasionally poor.   He has had difficutly telling time on an analog clock.   He feels cognition is worse when tired.  Adderall has helped cognition and ADD.     Physically he is doing well and feels better since the inspire device with less fatigue.    He tries to eat well and enjoys a [employer] and eats vegetables and low salt.       He has severe OSA (AHI = 39.7)  and was using  BiPAP nightly but had trouble tolerating it.  He switched to the [facility] device recently and feels better.    His wife notes snoring is almost resolved and he feels much better when he wakes up.         We had a long discussion today about his dementia.  Although the neurocognitive testing is consistent with Alzheimer's, the atrophy pattern on MRI is less consistent.  It is possible that he has a variant of Alzheimer's (such as logopenic variant primary progressive aphasia).   We discussed considering a PET scan or CSF testing for an amyloid disorder.  There also could be a blood test available by the end of the year.  Impression/Diagnosis:                     Overall, the results of the current neuropsychological evaluation were direct comparisons were made between the initial neuropsychological evaluation in 2021 and the most recent results this year show a significant progressive decline cognitive functions.  There is a significant decline from already impaired performance over the past 9 to 12 months.  While during the clinical interview the patient reports that he feels like he has improved some since the initial testing and has been working on treating his moderate to severe sleep apnea and reducing his use of benzodiazepine and while increasing or more consistently using psychostimulant medications, he continues to report  difficulties with geographic orientation, continued difficulties with memory although visual memory can come back when he steps back from the situation.  The patient reports that he is having to take much more time to think through issues and continues to struggle with mathematical another executive functioning patterns.  Current assessment as noted below suggest further progression in a wide range of cognitive domains including global cognitive functioning, expressive language functioning decline with significant deficits, further decline and significant deficits particularly for  auditory learning and memory with decline in his previous well-preserved visual memory and learning.  Encoding, storage and organization, retrieval and retention after period of delay were all noted in his significant memory deficits.  Significant executive functioning deficits were also noted for both verbal and visual modalities.  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  Even though there is a strong family history of Parkinson's disease there continue to be no significant evidence of Parkinson's dementia.  There is also only mild indications of microvascular ischemic changes and no evidence of stroke or cerebral hemorrhage.  Neuropsychological testing continue to strongly suggest involvement of parietal, temporal and frontal lobe brain regions.  While the patient's moderate to severe sleep apnea is and difficulty treating up to this point with BiPAP machine can produce significant memory and cognitive impacts the pattern and nature and progression of his cognitive decline is seen through both subjective reports and objective assessment would go well beyond those typically seen with untreated sleep apnea.  The patient has now had the inspire hypoglossal stimulator device implanted but we do not know yet how much this is helped his overall status.  As far as treatment recommendations, the patient is showing progression and further decline in overall cognitive functioning.  There will be increasing concerns about his continuation of driving as noted cognitive deficits and further decline are noted for visual spatial and visual judgment capacity, visual searching and speed of mental operations, and other executive functioning declines.  I will also sit down with the patient and his family and go over future planning and adjustments that can be made as well as making the information available to his  neurologist as far as potential medication strategies.  The primary medication he is taking at this point is Adderall to address his sleepiness and fatigue and reports that he has been reducing the amount of benzo diazepam he has been taking.    HISTORY OF COGNITIVE ISSUES:  I first saw him 04/22/2019.  At the time he presented with processing and attentional issues for the previous 6-12 months.   He noted planning and executing tasks were more difficult.  He has noted that he has had difficulty telling time on an analog clock.      He notes some difficulty with memory but has had more problems with processing the memory.  He is driving but feels he needs to pay much more attention than he used to.  He has made some errors in driving form point to point but has never been completely lost.   He has tinnitus and has often needed to focus more during conversatoin.   This is a little worse.      He has had problems coming up with the right words. He writes for fun and this is much more difficult.    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   He used to  be good at math and now has difficulty.   He can't estimate like he used to and is reliant on a a calculator.      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   He needs to work > 40 hours many weeks.     He has more trouble estimating jobs.    MRI of the brain 03/28/2019 was interpreted as showing mild chronic microvascular ischemic change and was otherwise normal.  However, I personally reviewed the MRI of the brain dated 03/28/2019 and feel that there is very significant atrophy, much more than expected for age especially in the parietal lobes, to lesser extent in the occipital and frontal lobes.  The medial temporal lobes did not show any atrophy.  There is also mild chronic microvascular ischemic change, a little more than expected for age.  I also reviewed recent lab work.  B12 and TSH and other labs are  normal.  Cognitive testing by neuropsychology in 2021 and 2022 was consistent with Alzheimer's disease.  Genetic testing:   We checked APoE genotype:  he is E3/E3  Vascular risk factors: Hypertension.     04/22/2019    6:44 PM  Montreal Cognitive Assessment   Visuospatial/ Executive (0/5) 3  Naming (0/3) 3  Attention: Read list of digits (0/2) 2  Attention: Read list of letters (0/1) 1  Attention: Serial 7 subtraction starting at 100 (0/3) 1  Language: Repeat phrase (0/2) 2  Language : Fluency (0/1) 0  Abstraction (0/2) 2  Delayed Recall (0/5) 0  Orientation (0/6) 6  Total 20  Adjusted Score (based on education) 20       02/21/2022    3:46 PM  MMSE - Mini Mental State Exam  Orientation to time 2  Orientation to Place 5  Registration 3  Attention/ Calculation 2  Recall 2  Language- name 2 objects 2  Language- repeat 1  Language- follow 3 step command 3  Language- read & follow direction 1  Write a sentence 1  Copy design 1  Total score 23      REVIEW OF SYSTEMS: Constitutional: No fevers, chills, sweats, or change in appetite Eyes: No visual changes, double vision, eye pain Ear, nose and throat: No hearing loss, ear pain, nasal congestion, sore throat Cardiovascular: No chest pain, palpitations Respiratory:  No shortness of breath at rest or with exertion.   No wheezes.  His wife notes severe snoring and pauses in his breathing at night. GastrointestinaI: No nausea, vomiting, diarrhea, abdominal pain, fecal incontinence Genitourinary:  No dysuria, urinary retention or frequency.  No nocturia. Musculoskeletal:  No neck pain, back pain Integumentary: No rash, pruritus, skin lesions Neurological: as above Psychiatric: No depression at this time.  No anxiety Endocrine: No palpitations, diaphoresis, change in appetite, change in weigh or increased thirst Hematologic/Lymphatic:  No anemia, purpura, petechiae. Allergic/Immunologic: No itchy/runny eyes, nasal  congestion, recent allergic reactions, rashes  ALLERGIES: No Known Allergies  HOME MEDICATIONS:  Current Outpatient Medications:    ALPRAZolam (XANAX) 0.25 MG tablet, Take 1 tablet by mouth 3 (three) times daily., Disp: , Rfl:    atorvastatin (LIPITOR) 80 MG tablet, Take 80 mg by mouth daily., Disp: , Rfl:    loteprednol (LOTEMAX) 0.5 % ophthalmic suspension, Place 1 drop into the left eye daily., Disp: , Rfl:    Multiple Vitamin tablet, Take by mouth., Disp: , Rfl:    ramipril (ALTACE) 5 MG capsule, Take 5 mg by  mouth daily., Disp: , Rfl:    tadalafil (CIALIS) 20 MG tablet, Take 10 mg by mouth as needed., Disp: , Rfl:    amphetamine-dextroamphetamine (ADDERALL) 20 MG tablet, Take 1 tablet (20 mg total) by mouth 2 (two) times daily., Disp: 60 tablet, Rfl: 0   donepezil (ARICEPT) 10 MG tablet, Take 2 tablets by mouth at night, Disp: 60 tablet, Rfl: 11  PAST MEDICAL HISTORY: Past Medical History:  Diagnosis Date   Anxiety    BPH (benign prostatic hyperplasia)    Decreased libido    Difficulty concentrating    Erectile dysfunction    GERD (gastroesophageal reflux disease)    Hyperlipidemia    Hypertension    Lipoma of back    Senile nuclear sclerosis     PAST SURGICAL HISTORY: Past Surgical History:  Procedure Laterality Date   arm surgery Left    COLONOSCOPY     CORNEAL TRANSPLANT Bilateral    CYST REMOVAL HAND     chest and back as well   HERNIA REPAIR Bilateral     FAMILY HISTORY: Family History  Problem Relation Age of Onset   Lung cancer Father     SOCIAL HISTORY:  Social History   Socioeconomic History   Marital status: Married    Spouse name: Not on file   Number of children: Not on file   Years of education: Not on file   Highest education level: Not on file  Occupational History   Not on file  Tobacco Use   Smoking status: Former   Smokeless tobacco: Never  Substance and Sexual Activity   Alcohol use: Yes    Comment: weekends   Drug use: Never    Sexual activity: Not on file  Other Topics Concern   Not on file  Social History Narrative   Lives with wife   Caffeine use: 2 cups coffee per day, tea at night   Right handed    Social Determinants of Health   Financial Resource Strain: Not on file  Food Insecurity: Not on file  Transportation Needs: Not on file  Physical Activity: Not on file  Stress: Not on file  Social Connections: Not on file  Intimate Partner Violence: Not on file     PHYSICAL EXAM  Vitals:   02/21/22 1609  BP: (!) 156/103  Pulse: (!) 58  Weight: 205 lb (93 kg)  Height: $Remove'5\' 10"'GofEfgr$  (1.778 m)     Body mass index is 29.41 kg/m.   General: The patient is well-developed and well-nourished and in no acute distress  HEENT:  Head is [place]/AT.  Sclera are anicteric.    Skin: Extremities are without rash or  edema.  Neurologic Exam  Mental status: See details of Mini-Mental status exam above.  Speech has become more dysfluent  Cranial nerves: Extraocular movements are full OD.  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  The right pupil is postoperative and he has reduced right vision. Facial symmetry is present. There is good facial sensation to soft touch bilaterally.Facial strength is normal.  Trapezius and sternocleidomastoid strength is normal. No dysarthria is noted.  No obvious hearing deficits are noted.  Motor:  Muscle bulk is normal.   Tone is normal. Strength is  5 / 5 in all 4 extremities.   Sensory: Sensory testing is intact to pinprick, soft touch and vibration sensation in all 4 extremities.  Coordination: Cerebellar testing reveals good finger-nose-finger and heel-to-shin bilaterally.  Gait and station: Station is normal.  The gait is normal.  Tandem  gait is mildly wide.  Romberg is negative.   Reflexes: Deep tendon reflexes are symmetric and normal bilaterally.        DIAGNOSTIC DATA (LABS, IMAGING, TESTING) - I reviewed patient records, labs, notes, testing and imaging myself where available.  Lab  Results  Component Value Date   WBC 7.5 03/25/2008   HGB 15.7 03/25/2008   HCT 47.3 03/25/2008   MCV 95.4 03/25/2008   PLT 252 03/25/2008      Component Value Date/Time   NA 138 03/25/2008 0913   K 5.1 MARKED HEMOLYSIS 03/25/2008 0913   CL 107 03/25/2008 0913   CO2 24 03/25/2008 0913   GLUCOSE 92 03/25/2008 0913   BUN 18 03/25/2008 0913   CREATININE 0.70 03/25/2008 0913   CALCIUM 9.9 03/25/2008 0913   GFRNONAA >60 03/25/2008 0913   GFRAA  03/25/2008 0913    >60        The eGFR has been calculated using the MDRD equation. This calculation has not been validated in all clinical       ASSESSMENT AND PLAN    1. Alzheimer's disease, unspecified (CODE) ([place])   2. Brain atrophy ([place])   3. Memory loss   4. OSA (obstructive sleep apnea)   5. Difficulty processing information   6. Adult attention deficit disorder      1.  He has atrophy that is predominantly in the parietal lobes and to a lesser extent occipital and posterior frontal lobes with very little atrophy in the temporal lobes.  However, neurocognitive testing was consistent with Alzheimer's disease.  I discussed with him and his wife that that is the probable diagnosis.  He feels there may been a benefit with donepezil and I will increase the dose to 20 mg.   2.   He is permanently disabled due to his significant cognitive disorder, possibly Alzheimer's disease.  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  He would be unable to learn skills to do a more physical job.  Additionally, he has language disturbances which would make finding gainful employment impossible. 3.   Adderall to 20 mg bid.   4.   Stay active and exercise as tolerated. 5..  Return in 6 months or sooner for new or worsening neurologic symptoms.  40-minute office visit with the majority of the time spent face-to-face for history and physical, discussion/counseling and decision-making.  Additional time with record  review and documentation.  Eather Chaires A. Felecia Shelling, MD, PhD, FAAN Certified in Neurology, Clinical Neurophysiology, Sleep Medicine, Pain Medicine and Neuroimaging Director, [facility] [address] [place], [place] [address] [phone]

## 2022-02-21 NOTE — Patient Instructions (Signed)
We discussed today that you most likely have Alzheimer's disease.  However, the pattern of atrophy on the brain MRI is not completely consistent with that diagnosis.  The cognitive testing you did was consistent with Alzheimer's.  There could be a blood test available for Alzheimer's by the end of the year and we will check this when it is available.  Alternatively, PET scans can often help to diagnose Alzheimer's as well as other dementias.  There is a medication that has been approved for Alzheimer's called lecanemab Fermin Schwab).  It is a monthly IV medication that may help to slow Alzheimer's down but also carries risk of bleeding in the brain.  Therefore, we would only consider this medicine if we were sure that you had Alzheimer's disease.

## 2022-03-30 ENCOUNTER — Other Ambulatory Visit: Payer: Self-pay | Admitting: Neurology

## 2022-03-30 MED ORDER — AMPHETAMINE-DEXTROAMPHETAMINE 20 MG PO TABS
20.0000 mg | ORAL_TABLET | Freq: Two times a day (BID) | ORAL | 0 refills | Status: DC
Start: 2022-03-30 — End: 2022-04-12

## 2022-03-30 NOTE — Telephone Encounter (Signed)
[  Pt's wife, Chad Tran request refill for amphetamine-dextroamphetamine (ADDERALL) 20 MG tablet at [place] [phone]

## 2022-04-11 ENCOUNTER — Encounter: Payer: Self-pay | Admitting: Neurology

## 2022-04-12 ENCOUNTER — Other Ambulatory Visit: Payer: Self-pay | Admitting: *Deleted

## 2022-04-12 MED ORDER — AMPHETAMINE-DEXTROAMPHETAMINE 20 MG PO TABS: ORAL_TABLET | ORAL | 0 refills | Status: DC

## 2022-04-26 ENCOUNTER — Encounter: Payer: Self-pay | Admitting: Neurology

## 2022-05-23 ENCOUNTER — Other Ambulatory Visit: Payer: Self-pay | Admitting: Neurology

## 2022-05-23 MED ORDER — AMPHETAMINE-DEXTROAMPHETAMINE 20 MG PO TABS: ORAL_TABLET | ORAL | 0 refills | Status: DC

## 2022-05-23 NOTE — Telephone Encounter (Signed)
Pt's wife, Tavien Chestnut request refill for amphetamine-dextroamphetamine (ADDERALL) 20 MG tablet at [place] [phone]

## 2022-07-03 ENCOUNTER — Other Ambulatory Visit: Payer: Self-pay | Admitting: Neurology

## 2022-07-03 MED ORDER — AMPHETAMINE-DEXTROAMPHETAMINE 20 MG PO TABS: ORAL_TABLET | ORAL | 0 refills | Status: DC

## 2022-07-03 NOTE — Telephone Encounter (Signed)
Pt wife is calling requesting a refill on medication amphetamine-dextroamphetamine (ADDERALL) 20 MG tablet. Refill should be sent to [facility] [phone] .

## 2022-08-02 ENCOUNTER — Other Ambulatory Visit: Payer: Self-pay | Admitting: Neurology

## 2022-08-02 MED ORDER — AMPHETAMINE-DEXTROAMPHETAMINE 20 MG PO TABS
ORAL_TABLET | ORAL | 0 refills | Status: DC
Start: 2022-08-02 — End: 2022-08-30

## 2022-08-02 NOTE — Telephone Encounter (Signed)
Pt is needing a refill request for his amphetamine-dextroamphetamine (ADDERALL) 20 MG tablet to be sent to the [facility] on [address] in [place]

## 2022-08-02 NOTE — Telephone Encounter (Signed)
Pt last seen 02/21/22 and next f/u 08/30/22. Per drug registry, last refilled 07/03/22 #[id number]

## 2022-08-22 DIAGNOSIS — Z0271 Encounter for disability determination: Secondary | ICD-10-CM

## 2022-08-30 ENCOUNTER — Ambulatory Visit (INDEPENDENT_AMBULATORY_CARE_PROVIDER_SITE_OTHER): Payer: 59 | Admitting: Neurology

## 2022-08-30 ENCOUNTER — Encounter: Payer: Self-pay | Admitting: Neurology

## 2022-08-30 VITALS — BP 138/75 | HR 55 | Ht 70.0 in | Wt 201.5 lb

## 2022-08-30 DIAGNOSIS — R4189 Other symptoms and signs involving cognitive functions and awareness: Secondary | ICD-10-CM

## 2022-08-30 DIAGNOSIS — G309 Alzheimer's disease, unspecified: Secondary | ICD-10-CM | POA: Diagnosis not present

## 2022-08-30 DIAGNOSIS — F988 Other specified behavioral and emotional disorders with onset usually occurring in childhood and adolescence: Secondary | ICD-10-CM

## 2022-08-30 DIAGNOSIS — R413 Other amnesia: Secondary | ICD-10-CM

## 2022-08-30 DIAGNOSIS — E559 Vitamin D deficiency, unspecified: Secondary | ICD-10-CM | POA: Diagnosis not present

## 2022-08-30 DIAGNOSIS — G319 Degenerative disease of nervous system, unspecified: Secondary | ICD-10-CM

## 2022-08-30 MED ORDER — AMPHETAMINE-DEXTROAMPHETAMINE 20 MG PO TABS: ORAL_TABLET | ORAL | 0 refills | Status: DC

## 2022-08-30 NOTE — Progress Notes (Signed)
[facility]  PATIENT: Chad Tran DOB: 02/05/1961  REFERRING DOCTOR OR PCP:  Kathryne Eriksson, MD; Clyde Lundborg SOURCE: Patient, notes from primary care, imaging and laboratory reports, MRI of the brain personally reviewed.  _________________________________   HISTORICAL  CHIEF COMPLAINT:  Chief Complaint  Patient presents with   Follow-up    Pt in room #10 with his wife. Pt here today to f/u with Alzheimer.    Update 08/30/2022: He feels he is stable since the last visit.   He nd his wife both feel that he has had more cognitive issues than he did last year though the progression is slow.  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   Earlier this year, he had another brain MRI - essentially unchanged compared to 2020.  It shows atrophy parietal > frontal / occipital but not much atrophy in temporal lobes.     He has difficulty with language and math as well as STM.   He had trouble reading a tape measure.   He notes sentence construction is occasionally poor.   He has had difficutly telling time on an analog clock.   He feels cognition is worse when tired.    He has some trouble with people's names.      He feels more self conscious due to his cognitive issues.    He does more trouble with conversation if there is background noise.    Neurocognitive  but not always. in 2021 and 2022 was abnormal.  There was some progression from 2021 to 2022.   I had reviewed the results of his recent neuro cognitive testing with him and his wife.  They are consistent with Alzheimer's disease.      Besides memory, he has difficulty with focus and attention.  This is better with Adderall. Adderall has helped cognition and ADD.     Physically he is doing well and feels better since the inspire device with less fatigue.    He tries to eat well and enjoys a [employer] and eats vegetables and low salt.       He has severe OSA (AHI = 39.7)   and was using BiPAP nightly but had trouble tolerating it.  He switched to the [facility] device recently and feels better.   There is much less snoring.   He lost soe weight this year also.      He is physically active and gardens daily.     No FH of cognitive issue.  His father had PD (late 9's onset).        08/30/2022    3:57 PM 02/21/2022    3:46 PM  MMSE - Mini Mental State Exam  Orientation to time 2 2  Orientation to Place 5 5  Registration 3 3  Attention/ Calculation 2 2  Recall 2 2  Language- name 2 objects 2 2  Language- repeat 1 1  Language- follow 3 step command 3 3  Language- read & follow direction 1 1  Write a sentence 1 1  Copy design 1 1  Total score 23 23          We had a long discussion today about his dementia.  Although the neurocognitive testing is consistent with Alzheimer's, the atrophy pattern on MRI is less consistent.  It is possible that he has a variant of Alzheimer's (such as logopenic variant primary progressive aphasia).   We discussed considering a PET scan or  CSF testing for an amyloid disorder.  There also could be a blood test available by the end of the year.  Impression/Diagnosis:                     Overall, the results of the current neuropsychological evaluation were direct comparisons were made between the initial neuropsychological evaluation in 2021 and the most recent results this year show a significant progressive decline cognitive functions.  There is a significant decline from already impaired performance over the past 9 to 12 months.  While during the clinical interview the patient reports that he feels like he has improved some since the initial testing and has been working on treating his moderate to severe sleep apnea and reducing his use of benzodiazepine and while increasing or more consistently using psychostimulant medications, he continues to report difficulties with geographic orientation, continued difficulties with memory  although visual memory can come back when he steps back from the situation.  The patient reports that he is having to take much more time to think through issues and continues to struggle with mathematical another executive functioning patterns.  Current assessment as noted below suggest further progression in a wide range of cognitive domains including global cognitive functioning, expressive language functioning decline with significant deficits, further decline and significant deficits particularly for auditory learning and memory with decline in his previous well-preserved visual memory and learning.  Encoding, storage and organization, retrieval and retention after period of delay were all noted in his significant memory deficits.  Significant executive functioning deficits were also noted for both verbal and visual modalities.  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  Even though there is a strong family history of Parkinson's disease there continue to be no significant evidence of Parkinson's dementia.  There is also only mild indications of microvascular ischemic changes and no evidence of stroke or cerebral hemorrhage.  Neuropsychological testing continue to strongly suggest involvement of parietal, temporal and frontal lobe brain regions.  While the patient's moderate to severe sleep apnea is and difficulty treating up to this point with BiPAP machine can produce significant memory and cognitive impacts the pattern and nature and progression of his cognitive decline is seen through both subjective reports and objective assessment would go well beyond those typically seen with untreated sleep apnea.  The patient has now had the inspire hypoglossal stimulator device implanted but we do not know yet how much this is helped his overall status.  As far as treatment recommendations, the patient is  showing progression and further decline in overall cognitive functioning.  There will be increasing concerns about his continuation of driving as noted cognitive deficits and further decline are noted for visual spatial and visual judgment capacity, visual searching and speed of mental operations, and other executive functioning declines.  I will also sit down with the patient and his family and go over future planning and adjustments that can be made as well as making the information available to his neurologist as far as potential medication strategies.  The primary medication he is taking at this point is Adderall to address his sleepiness and fatigue and reports that he has been reducing the amount of benzo diazepam he has been taking.    HISTORY OF COGNITIVE ISSUES:  I first saw him 04/22/2019.  At the time he presented with processing and attentional issues for the previous 6-12  months.   He noted planning and executing tasks were more difficult.  He has noted that he has had difficulty telling time on an analog clock.      He notes some difficulty with memory but has had more problems with processing the memory.  He is driving but feels he needs to pay much more attention than he used to.  He has made some errors in driving form point to point but has never been completely lost.   He has tinnitus and has often needed to focus more during conversatoin.   This is a little worse.      He has had problems coming up with the right words. He writes for fun and this is much more difficult.    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   He used to be good at math and now has difficulty.   He can't estimate like he used to and is reliant on a a calculator.      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   He needs to work > 40 hours many weeks.     He has more trouble estimating jobs.    MRI of the brain 03/28/2019 was interpreted as showing mild chronic microvascular  ischemic change and was otherwise normal.  However, I personally reviewed the MRI of the brain dated 03/28/2019 and feel that there is very significant atrophy, much more than expected for age especially in the parietal lobes, to lesser extent in the occipital and frontal lobes.  The medial temporal lobes did not show any atrophy.  There is also mild chronic microvascular ischemic change, a little more than expected for age.  I also reviewed recent lab work.  B12 and TSH and other labs are normal.  Cognitive testing by neuropsychology in 2021 and 2022 was consistent with Alzheimer's disease.  Genetic testing:   We checked APoE genotype:  he is E3/E3  Vascular risk factors: Hypertension.     04/22/2019    6:44 PM  Montreal Cognitive Assessment   Visuospatial/ Executive (0/5) 3  Naming (0/3) 3  Attention: Read list of digits (0/2) 2  Attention: Read list of letters (0/1) 1  Attention: Serial 7 subtraction starting at 100 (0/3) 1  Language: Repeat phrase (0/2) 2  Language : Fluency (0/1) 0  Abstraction (0/2) 2  Delayed Recall (0/5) 0  Orientation (0/6) 6  Total 20  Adjusted Score (based on education) 20       08/30/2022    3:57 PM 02/21/2022    3:46 PM  MMSE - Mini Mental State Exam  Orientation to time 2 2  Orientation to Place 5 5  Registration 3 3  Attention/ Calculation 2 2  Recall 2 2  Language- name 2 objects 2 2  Language- repeat 1 1  Language- follow 3 step command 3 3  Language- read & follow direction 1 1  Write a sentence 1 1  Copy design 1 1  Total score 23 23      REVIEW OF SYSTEMS: Constitutional: No fevers, chills, sweats, or change in appetite Eyes: No visual changes, double vision, eye pain Ear, nose and throat: No hearing loss, ear pain, nasal congestion, sore throat Cardiovascular: No chest pain, palpitations Respiratory:  No shortness of breath at rest or with exertion.   No wheezes.  His wife notes severe snoring and pauses in his breathing at  night. GastrointestinaI: No nausea,  vomiting, diarrhea, abdominal pain, fecal incontinence Genitourinary:  No dysuria, urinary retention or frequency.  No nocturia. Musculoskeletal:  No neck pain, back pain Integumentary: No rash, pruritus, skin lesions Neurological: as above Psychiatric: No depression at this time.  No anxiety Endocrine: No palpitations, diaphoresis, change in appetite, change in weigh or increased thirst Hematologic/Lymphatic:  No anemia, purpura, petechiae. Allergic/Immunologic: No itchy/runny eyes, nasal congestion, recent allergic reactions, rashes  ALLERGIES: No Known Allergies  HOME MEDICATIONS:  Current Outpatient Medications:    ALPRAZolam (XANAX) 0.25 MG tablet, Take 1 tablet by mouth 3 (three) times daily., Disp: , Rfl:    donepezil (ARICEPT) 10 MG tablet, Take 2 tablets by mouth at night, Disp: 60 tablet, Rfl: 11   loteprednol (LOTEMAX) 0.5 % ophthalmic suspension, Place 1 drop into the left eye daily., Disp: , Rfl:    Multiple Vitamin tablet, Take by mouth., Disp: , Rfl:    ramipril (ALTACE) 5 MG capsule, Take 5 mg by mouth daily., Disp: , Rfl:    tadalafil (CIALIS) 20 MG tablet, Take 10 mg by mouth as needed., Disp: , Rfl:    amphetamine-dextroamphetamine (ADDERALL) 20 MG tablet, Take 1 po three times a day, Disp: 90 tablet, Rfl: 0   atorvastatin (LIPITOR) 80 MG tablet, Take 80 mg by mouth daily. (Patient not taking: Reported on 08/30/2022), Disp: , Rfl:   PAST MEDICAL HISTORY: Past Medical History:  Diagnosis Date   Anxiety    BPH (benign prostatic hyperplasia)    Decreased libido    Difficulty concentrating    Erectile dysfunction    GERD (gastroesophageal reflux disease)    Hyperlipidemia    Hypertension    Lipoma of back    Senile nuclear sclerosis     PAST SURGICAL HISTORY: Past Surgical History:  Procedure Laterality Date   arm surgery Left    COLONOSCOPY     CORNEAL TRANSPLANT Bilateral    CYST REMOVAL HAND     chest and back as  well   HERNIA REPAIR Bilateral     FAMILY HISTORY: Family History  Problem Relation Age of Onset   Lung cancer Father     SOCIAL HISTORY:  Social History   Socioeconomic History   Marital status: Married    Spouse name: Not on file   Number of children: Not on file   Years of education: Not on file   Highest education level: Not on file  Occupational History   Not on file  Tobacco Use   Smoking status: Former   Smokeless tobacco: Never  Substance and Sexual Activity   Alcohol use: Yes    Comment: weekends   Drug use: Never   Sexual activity: Not on file  Other Topics Concern   Not on file  Social History Narrative   Lives with wife   Caffeine use: 2 cups coffee per day, tea at night   Right handed    Social Determinants of Health   Financial Resource Strain: Not on file  Food Insecurity: Not on file  Transportation Needs: Not on file  Physical Activity: Not on file  Stress: Not on file  Social Connections: Not on file  Intimate Partner Violence: Not on file     PHYSICAL EXAM  Vitals:   08/30/22 1543  BP: 138/75  Pulse: (!) 55  Weight: 201 lb 8 oz (91.4 kg)  Height: _0  (1.778 m)     Body mass index is 28.91 kg/m.   General: The patient is well-developed and well-nourished and  in no acute distress  HEENT:  Head is [place]/AT.  Sclera are anicteric.    Skin: Extremities are without rash or  edema.  Neurologic Exam  Mental status: See details of Mini-Mental status exam above.  Speech has become more dysfluent.   No finger agnosia.   Good L/R  Cranial nerves: Extraocular movements are full OD.  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  The right pupil is postoperative and he has reduced right vision. Facial symmetry is present. There is good facial sensation to soft touch bilaterally.Facial strength is normal.  Trapezius and sternocleidomastoid strength is normal. No dysarthria is noted.  No obvious hearing deficits are noted.  Motor:  Muscle bulk is normal.   Tone  is normal. Strength is  5 / 5 in all 4 extremities.   Sensory: Sensory testing is intact to pinprick, soft touch and vibration sensation in all 4 extremities.  Coordination: Cerebellar testing reveals good finger-nose-finger and heel-to-shin bilaterally.  Gait and station: Station is normal.  The gait is normal.  Has a mildly wide tandem gait.  Romberg is negative.  Reflexes: Deep tendon reflexes are symmetric and normal bilaterally.        DIAGNOSTIC DATA (LABS, IMAGING, TESTING) - I reviewed patient records, labs, notes, testing and imaging myself where available.  Lab Results  Component Value Date   WBC 7.5 03/25/2008   HGB 15.7 03/25/2008   HCT 47.3 03/25/2008   MCV 95.4 03/25/2008   PLT 252 03/25/2008      Component Value Date/Time   NA 138 03/25/2008 0913   K 5.1 MARKED HEMOLYSIS 03/25/2008 0913   CL 107 03/25/2008 0913   CO2 24 03/25/2008 0913   GLUCOSE 92 03/25/2008 0913   BUN 18 03/25/2008 0913   CREATININE 0.70 03/25/2008 0913   CALCIUM 9.9 03/25/2008 0913   GFRNONAA >60 03/25/2008 0913   GFRAA  03/25/2008 0913    >60        The eGFR has been calculated using the MDRD equation. This calculation has not been validated in all clinical       ASSESSMENT AND PLAN    1. Alzheimer's disease, unspecified (CODE) ([place])   2. Memory loss   3. Vitamin D deficiency   4. Difficulty processing information   5. Adult attention deficit disorder   6. Brain atrophy ([place])       1.  I will check the amyloid 42/40 ratio, [id number] and neurofilament light panel to help determine if he has Alzheimer's (ovarian) or any other neurodegenerative process.  The brain atrophy is predominantly in the parietal lobes and to a lesser extent occipital and posterior frontal lobes with very little atrophy in the temporal lobes.  However, neurocognitive testing was consistent with Alzheimer's disease.  I discussed with him and his wife that that is the probable diagnosis.   2.   He is  permanently disabled due to his significant cognitive disorder, possibly Alzheimer's disease.  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  He would be unable to learn skills to do a more physical job.  Additionally, he has language disturbances which would make finding gainful employment impossible. 3.   Continue donepezil and continue Adderall 20 mg 3 times daily .   4.   Stay active and exercise as tolerated. 5..  Return in 6 months or sooner for new or worsening neurologic symptoms.  45-minute office visit with the majority of the time spent face-to-face for history and physical, discussion/counseling and  decision-making.  Additional time with record review and documentation.  Gesselle Fitzsimons A. Felecia Shelling, MD, PhD, FAAN Certified in Neurology, Clinical Neurophysiology, Sleep Medicine, Pain Medicine and Neuroimaging Director, [place] [facility] [address], [place], [place] [address] [phone]

## 2022-09-02 LAB — ATN PROFILE
A -- Beta-amyloid 42/40 Ratio: 0.084 — ABNORMAL LOW (ref >0.102)
Beta-amyloid 40: 246.08 pg/mL
Beta-amyloid 42: 20.69 pg/mL
N -- NfL, Plasma: 2.7 pg/mL (ref 0.00–4.61)
T -- p-tau181: 1.38 pg/mL — ABNORMAL HIGH (ref 0.00–0.97)

## 2022-09-02 LAB — VITAMIN D 25 HYDROXY (VIT D DEFICIENCY, FRACTURES): Vit D, 25-Hydroxy: 27.6 ng/mL — ABNORMAL LOW (ref 30.0–100.0)

## 2022-09-02 LAB — VITAMIN B12: Vitamin B-12: 891 pg/mL (ref 232–1245)

## 2022-09-06 ENCOUNTER — Encounter: Payer: Self-pay | Admitting: *Deleted

## 2022-10-02 ENCOUNTER — Other Ambulatory Visit: Payer: Self-pay | Admitting: Neurology

## 2022-10-02 MED ORDER — AMPHETAMINE-DEXTROAMPHETAMINE 20 MG PO TABS: ORAL_TABLET | ORAL | 0 refills | Status: DC

## 2022-10-02 NOTE — Telephone Encounter (Signed)
Checked drug registry, last refilled 08/31/22 #90. Last seen 08/30/22 and next f/u 03/01/23.

## 2022-10-02 NOTE — Telephone Encounter (Signed)
Pt wife is calling and requesting a refill on amphetamine-dextroamphetamine (ADDERALL) 20 MG tablet. Should be sent to  [place] [phone]

## 2022-10-30 ENCOUNTER — Other Ambulatory Visit: Payer: Self-pay | Admitting: Neurology

## 2022-10-30 MED ORDER — AMPHETAMINE-DEXTROAMPHETAMINE 20 MG PO TABS: ORAL_TABLET | ORAL | 0 refills | Status: DC

## 2022-10-30 NOTE — Telephone Encounter (Signed)
Pt last seen 08/30/22 and next f/u 03/01/23. Per drug registry, last refilled 10/02/22 #90.

## 2022-10-30 NOTE — Telephone Encounter (Signed)
Pt is requesting a refill for amphetamine-dextroamphetamine (ADDERALL) 10 MG tablet.  Pharmacy: [facility] [place]

## 2022-12-01 ENCOUNTER — Telehealth: Payer: Self-pay | Admitting: Neurology

## 2022-12-01 MED ORDER — AMPHETAMINE-DEXTROAMPHETAMINE 20 MG PO TABS: ORAL_TABLET | ORAL | 0 refills | Status: DC

## 2022-12-01 NOTE — Addendum Note (Signed)
Addended by: Andrey Spearman R on: 12/01/2022 12:17 PM   Modules accepted: Orders

## 2022-12-01 NOTE — Telephone Encounter (Signed)
Meds ordered this encounter  Medications   amphetamine-dextroamphetamine (ADDERALL) 20 MG tablet    Sig: Take 1 po three times a day    Dispense:  90 tablet    Refill:  0    Penni Bombard, MD [id number], [id number] PM Certified in Neurology, Neurophysiology and Neuroimaging  [facility] [address] [place],  [address] [phone]

## 2022-12-01 NOTE — Telephone Encounter (Signed)
Wife reports that pt does not have amphetamine-dextroamphetamine (ADDERALL) 20 MG tablet to get him beyond today.  She has asked the request be forwarded to On call to call in the amphetamine-dextroamphetamine (ADDERALL) 20 MG tablet  to [place] [phone]

## 2023-01-01 ENCOUNTER — Other Ambulatory Visit: Payer: Self-pay | Admitting: Neurology

## 2023-01-01 MED ORDER — AMPHETAMINE-DEXTROAMPHETAMINE 20 MG PO TABS: ORAL_TABLET | ORAL | 0 refills | Status: DC

## 2023-01-01 NOTE — Telephone Encounter (Signed)
Pt wife called and requested a refill on amphetamine-dextroamphetamine (ADDERALL) 20 MG tablet. Should be sent to [facility] [phone]

## 2023-01-01 NOTE — Telephone Encounter (Signed)
Dr. 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   Continue donepezil and continue Adderall 20 mg 3 times daily . "  Follow up scheduled on 03/01/23  Last filled on 12/01/22 #90 tables (30 day supply)  Rx pending to be signed

## 2023-01-30 ENCOUNTER — Other Ambulatory Visit: Payer: Self-pay | Admitting: Neurology

## 2023-01-30 MED ORDER — AMPHETAMINE-DEXTROAMPHETAMINE 20 MG PO TABS: ORAL_TABLET | ORAL | 0 refills | Status: DC

## 2023-01-30 NOTE — Telephone Encounter (Signed)
Pt requesting a refill on amphetamine-dextroamphetamine (ADDERALL) 20 MG table. Should be sent  [facility] [phone]

## 2023-01-30 NOTE — Telephone Encounter (Signed)
Last seen 08/30/22 and next f/u 03/01/23. Last refilled adderall 20mg  01/01/23 #90.

## 2023-02-26 ENCOUNTER — Other Ambulatory Visit: Payer: Self-pay | Admitting: Neurology

## 2023-02-26 ENCOUNTER — Encounter: Payer: Self-pay | Admitting: Neurology

## 2023-03-01 ENCOUNTER — Encounter: Payer: Self-pay | Admitting: Neurology

## 2023-03-01 ENCOUNTER — Ambulatory Visit (INDEPENDENT_AMBULATORY_CARE_PROVIDER_SITE_OTHER): Payer: 59 | Admitting: Neurology

## 2023-03-01 VITALS — BP 159/78 | HR 56 | Ht 70.0 in | Wt 208.0 lb

## 2023-03-01 DIAGNOSIS — F988 Other specified behavioral and emotional disorders with onset usually occurring in childhood and adolescence: Secondary | ICD-10-CM

## 2023-03-01 DIAGNOSIS — R4189 Other symptoms and signs involving cognitive functions and awareness: Secondary | ICD-10-CM | POA: Diagnosis not present

## 2023-03-01 DIAGNOSIS — G4733 Obstructive sleep apnea (adult) (pediatric): Secondary | ICD-10-CM

## 2023-03-01 DIAGNOSIS — G309 Alzheimer's disease, unspecified: Secondary | ICD-10-CM | POA: Diagnosis not present

## 2023-03-01 DIAGNOSIS — G319 Degenerative disease of nervous system, unspecified: Secondary | ICD-10-CM | POA: Diagnosis not present

## 2023-03-01 DIAGNOSIS — E559 Vitamin D deficiency, unspecified: Secondary | ICD-10-CM

## 2023-03-01 DIAGNOSIS — R413 Other amnesia: Secondary | ICD-10-CM | POA: Diagnosis not present

## 2023-03-01 MED ORDER — BUSPIRONE HCL 15 MG PO TABS
15.0000 mg | ORAL_TABLET | Freq: Two times a day (BID) | ORAL | 5 refills | Status: DC
Start: 2023-03-01 — End: 2023-09-10

## 2023-03-01 MED ORDER — AMPHETAMINE-DEXTROAMPHETAMINE 20 MG PO TABS: ORAL_TABLET | ORAL | 0 refills | Status: DC

## 2023-03-01 NOTE — Progress Notes (Signed)
[facility]  PATIENT: Chad Tran DOB: [birth year]  REFERRING DOCTOR OR PCP:  Benedetto Goad, MD; Rueben Bash SOURCE: Patient, notes from primary care, imaging and laboratory reports, MRI of the brain personally reviewed.  _________________________________   HISTORICAL  CHIEF COMPLAINT:  Chief Complaint  Patient presents with   Room 11    Pt is here with his Wife. Pt states that he has been doing okay since his last appointment. Pt states he feels better than ever.    Update 03/01/2023 Since the last visit, he has the ATM profile.  The amyloid 42/40 ratio is reduced and the pTau181 is elevated.  This pattern is very consistent with amyloid but not.  We had a discussion with this.  His diagnosis would be consistent with Alzheimer's disease.  However, the pattern of atrophy on the MRI (parietal atrophy, L>R, more so than medial temporal) and his difficulties with speech also make local panic dementia, a variant of Alzheimer's disease, possible.  He feels he is stable but his wife notes good days and bad days.  He does worse when he is stable.    He reports doing mechanical tasks but his wife notes tasks are taking longer.    He notes being more unorganized.  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  It shows atrophy parietal > frontal / occipital but not much atrophy in temporal lobes.     He has difficulty with language and math as well as STM.   He had trouble reading a tape measure.   He notes sentence construction is occasionally poor.   He has had difficutly telling time on an analog clock.   He feels cognition is worse when tired.    He has some trouble with people's names.      He feels more self conscious due to his cognitive issues.    He does more trouble with conversation if there is background noise.    Neurocognitive  but  not always. in 2021 and 2022 was abnormal.  There was some progression from 2021 to 2022.   At that time, I had reviewed the results of his recent neuro cognitive testing with him and his wife.  They are consistent with Alzheimer's disease.      Besides memory, he has difficulty with focus and attention.  This is better with Adderall. Adderall has helped cognition and ADD.     Physically he is doing well and feels better since the inspire device with less fatigue.    He tries to eat well and enjoys a [employer] and eats vegetables and low salt.       He has severe OSA (AHI = 39.7)  and was using BiPAP nightly but had trouble tolerating it.  He switched to the [facility] device recently and feels better.   There is much less snoring.   He lost soe weight this year also.      He is physically active and gardens daily.   He was able to get disability.  Unfortunately, his business went back prepped.  He has some anxiety though it is better with some simplification after resolution of his business status.  We discussed BuSpar to get help on a more continuous basis.  No FH of cognitive issue.  His father had PD (late 37's onset).  03/01/2023    3:58 PM 08/30/2022    3:57 PM 02/21/2022    3:46 PM  MMSE - Mini Mental State Exam  Orientation to time 3 2 2   Orientation to Place 4 5 5   Registration 3 3 3   Attention/ Calculation 1 2 2   Recall 0 2 2  Language- name 2 objects 2 2 2   Language- repeat 1 1 1   Language- follow 3 step command 3 3 3   Language- read & follow direction 1 1 1   Write a sentence 1 1 1   Copy design 1 1 1   Total score 20 23 23             Impression/Diagnosis 06/07/2021:      Overall, the results of the current neuropsychological evaluation were direct comparisons were made between the initial neuropsychological evaluation in 2021 and the most recent results this year show a significant progressive decline cognitive functions.  There is a significant decline from  already impaired performance over the past 9 to 12 months.  While during the clinical interview the patient reports that he feels like he has improved some since the initial testing and has been working on treating his moderate to severe sleep apnea and reducing his use of benzodiazepine and while increasing or more consistently using psychostimulant medications, he continues to report difficulties with geographic orientation, continued difficulties with memory although visual memory can come back when he steps back from the situation.  The patient reports that he is having to take much more time to think through issues and continues to struggle with mathematical another executive functioning patterns.  Current assessment as noted below suggest further progression in a wide range of cognitive domains including global cognitive functioning, expressive language functioning decline with significant deficits, further decline and significant deficits particularly for auditory learning and memory with decline in his previous well-preserved visual memory and learning.  Encoding, storage and organization, retrieval and retention after period of delay were all noted in his significant memory deficits.  Significant executive functioning deficits were also noted for both verbal and visual modalities.  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  Even though there is a strong family history of Parkinson's disease there continue to be no significant evidence of Parkinson's dementia.  There is also only mild indications of microvascular ischemic changes and no evidence of stroke or cerebral hemorrhage.  Neuropsychological testing continue to strongly suggest involvement of parietal, temporal and frontal lobe brain regions.  While the patient's moderate to severe sleep apnea is and difficulty treating up to this point with  BiPAP machine can produce significant memory and cognitive impacts the pattern and nature and progression of his cognitive decline is seen through both subjective reports and objective assessment would go well beyond those typically seen with untreated sleep apnea.  The patient has now had the inspire hypoglossal stimulator device implanted but we do not know yet how much this is helped his overall status.  As far as treatment recommendations, the patient is showing progression and further decline in overall cognitive functioning.  There will be increasing concerns about his continuation of driving as noted cognitive deficits and further decline are noted for visual spatial and visual judgment capacity, visual searching and speed of mental operations, and other executive functioning declines.  I will also sit down with the patient and his family and go over future planning and adjustments that  can be made as well as making the information available to his neurologist as far as potential medication strategies.  The primary medication he is taking at this point is Adderall to address his sleepiness and fatigue and reports that he has been reducing the amount of benzo diazepam he has been taking.    HISTORY OF COGNITIVE ISSUES:  I first saw him 04/22/2019.  At the time he presented with processing and attentional issues for the previous 6-12 months.   He noted planning and executing tasks were more difficult.  He has noted that he has had difficulty telling time on an analog clock.      He notes some difficulty with memory but has had more problems with processing the memory.  He is driving but feels he needs to pay much more attention than he used to.  He has made some errors in driving form point to point but has never been completely lost.   He has tinnitus and has often needed to focus more during conversatoin.   This is a little worse.      He has had problems coming up with the right words. He writes  for fun and this is much more difficult.    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   He used to be good at math and now has difficulty.   He can't estimate like he used to and is reliant on a a calculator.      He owns a [employer].  He noted more trouble running it in 2020.   He needs to work > 40 hours many weeks.     He has more trouble estimating jobs.    MRI of the brain 03/28/2019 was interpreted as showing mild chronic microvascular ischemic change and was otherwise normal.  However, I personally reviewed the MRI of the brain dated 03/28/2019 and feel that there is very significant atrophy, much more than expected for age especially in the parietal lobes, to lesser extent in the occipital and frontal lobes.  The medial temporal lobes did not show any atrophy.  There is also mild chronic microvascular ischemic change, a little more than expected for age.  I also reviewed recent lab work.  B12 and TSH and other labs are normal.  Cognitive testing by neuropsychology in 2021 and 2022 was consistent with Alzheimer's disease.  Genetic testing:   We checked APoE genotype:  he is E3/E3  ATM profile, reduce amyloid beta 42/40 ratio and elevated pTau181.  NFL was normal.  Vascular risk factors: Hypertension.     04/22/2019    6:44 PM  Montreal Cognitive Assessment   Visuospatial/ Executive (0/5) 3  Naming (0/3) 3  Attention: Read list of digits (0/2) 2  Attention: Read list of letters (0/1) 1  Attention: Serial 7 subtraction starting at 100 (0/3) 1  Language: Repeat phrase (0/2) 2  Language : Fluency (0/1) 0  Abstraction (0/2) 2  Delayed Recall (0/5) 0  Orientation (0/6) 6  Total 20  Adjusted Score (based on education) 20       03/01/2023    3:58 PM 08/30/2022    3:57 PM 02/21/2022    3:46 PM  MMSE - Mini Mental State Exam  Orientation to time 3 2 2   Orientation to Place 4 5 5   Registration 3 3 3   Attention/ Calculation 1 2 2   Recall 0 2 2  Language- name 2  objects 2 2 2   Language- repeat  1 1 1   Language- follow 3 step command 3 3 3   Language- read & follow direction 1 1 1   Write a sentence 1 1 1   Copy design 1 1 1   Total score 20 23 23       REVIEW OF SYSTEMS: Constitutional: No fevers, chills, sweats, or change in appetite Eyes: No visual changes, double vision, eye pain Ear, nose and throat: No hearing loss, ear pain, nasal congestion, sore throat Cardiovascular: No chest pain, palpitations Respiratory:  No shortness of breath at rest or with exertion.   No wheezes.  His wife notes severe snoring and pauses in his breathing at night. GastrointestinaI: No nausea, vomiting, diarrhea, abdominal pain, fecal incontinence Genitourinary:  No dysuria, urinary retention or frequency.  No nocturia. Musculoskeletal:  No neck pain, back pain Integumentary: No rash, pruritus, skin lesions Neurological: as above Psychiatric: No depression at this time.  No anxiety Endocrine: No palpitations, diaphoresis, change in appetite, change in weigh or increased thirst Hematologic/Lymphatic:  No anemia, purpura, petechiae. Allergic/Immunologic: No itchy/runny eyes, nasal congestion, recent allergic reactions, rashes  ALLERGIES: No Known Allergies  HOME MEDICATIONS:  Current Outpatient Medications:    ALPRAZolam (XANAX) 0.25 MG tablet, Take 1 tablet by mouth 3 (three) times daily., Disp: , Rfl:    busPIRone (BUSPAR) 15 MG tablet, Take 1 tablet (15 mg total) by mouth 2 (two) times daily., Disp: 60 tablet, Rfl: 5   donepezil (ARICEPT) 10 MG tablet, TAKE TWO TABLETS BY MOUTH EVERY EVENING, Disp: 60 tablet, Rfl: 0   loteprednol (LOTEMAX) 0.5 % ophthalmic suspension, Place 1 drop into the left eye daily., Disp: , Rfl:    Multiple Vitamin tablet, Take by mouth., Disp: , Rfl:    ramipril (ALTACE) 5 MG capsule, Take 5 mg by mouth daily., Disp: , Rfl:    tadalafil (CIALIS) 20 MG tablet, Take 10 mg by mouth as needed., Disp: , Rfl:     amphetamine-dextroamphetamine (ADDERALL) 20 MG tablet, Take 1 po three times a day, Disp: 90 tablet, Rfl: 0   atorvastatin (LIPITOR) 80 MG tablet, Take 80 mg by mouth daily. (Patient not taking: Reported on 08/30/2022), Disp: , Rfl:   PAST MEDICAL HISTORY: Past Medical History:  Diagnosis Date   Anxiety    BPH (benign prostatic hyperplasia)    Decreased libido    Difficulty concentrating    Erectile dysfunction    GERD (gastroesophageal reflux disease)    Hyperlipidemia    Hypertension    Lipoma of back    Senile nuclear sclerosis     PAST SURGICAL HISTORY: Past Surgical History:  Procedure Laterality Date   arm surgery Left    COLONOSCOPY     CORNEAL TRANSPLANT Bilateral    CYST REMOVAL HAND     chest and back as well   HERNIA REPAIR Bilateral     FAMILY HISTORY: Family History  Problem Relation Age of Onset   Lung cancer Father     SOCIAL HISTORY:  Social History   Socioeconomic History   Marital status: Married    Spouse name: Not on file   Number of children: Not on file   Years of education: Not on file   Highest education level: Not on file  Occupational History   Not on file  Tobacco Use   Smoking status: Former   Smokeless tobacco: Never  Substance and Sexual Activity   Alcohol use: Yes    Comment: weekends   Drug use: Never   Sexual activity: Not on file  Other Topics Concern   Not on file  Social History Narrative   Lives with wife   Caffeine use: 2 cups coffee per day, tea at night   Right handed    Social Determinants of Health   Financial Resource Strain: Not on file  Food Insecurity: Not on file  Transportation Needs: Not on file  Physical Activity: Not on file  Stress: Not on file  Social Connections: Not on file  Intimate Partner Violence: Not on file     PHYSICAL EXAM  Vitals:   03/01/23 1551  BP: (!) 159/78  Pulse: (!) 56  Weight: 208 lb (94.3 kg)  Height: 5\' 10"  (1.778 m)     Body mass index is 29.84  kg/m.   General: The patient is well-developed and well-nourished and in no acute distress  HEENT:  Head is [place]/AT.  Sclera are anicteric.    Skin: Extremities are without rash or  edema.  Neurologic Exam  Mental status: See details of Mini-Mental status exam above.  Speech is mildly dysfluent.   No finger agnosia.   Good L/R  Cranial nerves: Extraocular movements are full OD.  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  The right pupil is postoperative and he has reduced right vision. Facial symmetry is present. There is good facial sensation to soft touch bilaterally.Facial strength is normal.  Trapezius and sternocleidomastoid strength is normal. No dysarthria is noted.  No obvious hearing deficits are noted.  Motor:  Muscle bulk is normal.   Tone is normal. Strength is  5 / 5 in all 4 extremities.   Sensory: Sensory testing is intact to pinprick, soft touch and vibration sensation in all 4 extremities.  Coordination: Cerebellar testing reveals good finger-nose-finger and heel-to-shin bilaterally.  Gait and station: Station is normal.  The gait is normal.  Has a wide tandem gait.  Romberg is negative.  Reflexes: Deep tendon reflexes are symmetric and normal bilaterally.        DIAGNOSTIC DATA (LABS, IMAGING, TESTING) - I reviewed patient records, labs, notes, testing and imaging myself where available.  Lab Results  Component Value Date   WBC 7.5 03/25/2008   HGB 15.7 03/25/2008   HCT 47.3 03/25/2008   MCV 95.4 03/25/2008   PLT 252 03/25/2008      Component Value Date/Time   NA 138 03/25/2008 0913   K 5.1 MARKED HEMOLYSIS 03/25/2008 0913   CL 107 03/25/2008 0913   CO2 24 03/25/2008 0913   GLUCOSE 92 03/25/2008 0913   BUN 18 03/25/2008 0913   CREATININE 0.70 03/25/2008 0913   CALCIUM 9.9 03/25/2008 0913   GFRNONAA >60 03/25/2008 0913   GFRAA  03/25/2008 0913    >60        The eGFR has been calculated using the MDRD equation. This calculation has not been validated in all clinical        ASSESSMENT AND PLAN    1. Alzheimer's disease, unspecified (CODE) (HCC)   2. Memory loss   3. Difficulty processing information   4. Brain atrophy (HCC)   5. Vitamin D deficiency   6. Adult attention deficit disorder   7. OSA (obstructive sleep apnea)       1.  He has AD or related variant (has more than typical dysfluency).    He is not interested in [place].  He will continue donepezil.  Consider memantine. 2.   He is permanently disabled due to his significant cognitive disorder, possibly Alzheimer's disease.  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  He would be unable to learn skills to do a more physical job.  Additionally, he has language disturbances which would make finding gainful employment impossible. 3.  Adderall 20 mg 3 times daily .  This has helped processing and attention.  Add BuSpar for mood. 4.   Stay active and exercise as tolerated. 5..  Return in 6 months or sooner for new or worsening neurologic symptoms.  45-minute office visit with the majority of the time spent face-to-face for history and physical, discussion/counseling and decision-making.  Imaging studies and lab results were reviewed personally.  Additional time with record review and documentation.  Lana Flaim A. Epimenio Foot, MD, PhD, FAAN Certified in Neurology, Clinical Neurophysiology, Sleep Medicine, Pain Medicine and Neuroimaging Director, [facility]  [facility] [address], [address] [place], [place] [address] [phone]

## 2023-03-02 ENCOUNTER — Encounter: Payer: Self-pay | Admitting: Neurology

## 2023-03-29 ENCOUNTER — Other Ambulatory Visit: Payer: Self-pay | Admitting: Neurology

## 2023-03-29 NOTE — Telephone Encounter (Signed)
Last seen on 03/01/23 Follow up scheduled on 09/18/23

## 2023-04-02 ENCOUNTER — Other Ambulatory Visit: Payer: Self-pay | Admitting: Neurology

## 2023-04-02 MED ORDER — AMPHETAMINE-DEXTROAMPHETAMINE 20 MG PO TABS: ORAL_TABLET | ORAL | 0 refills | Status: DC

## 2023-04-02 NOTE — Telephone Encounter (Signed)
Pt last seen on 03/01/23 Follow up scheduled on 09/18/23 Last filled on 03/01/23 #90 tablets (30 day supply) Rx pending to be signed

## 2023-04-02 NOTE — Telephone Encounter (Signed)
Pt is requesting a refill for amphetamine-dextroamphetamine (ADDERALL) 20 MG tablet.  Pharmacy: [facility] [id number]

## 2023-05-02 ENCOUNTER — Other Ambulatory Visit: Payer: Self-pay | Admitting: Neurology

## 2023-05-02 MED ORDER — AMPHETAMINE-DEXTROAMPHETAMINE 20 MG PO TABS: ORAL_TABLET | ORAL | 0 refills | Status: DC

## 2023-05-02 NOTE — Telephone Encounter (Signed)
Requested Prescriptions   Pending Prescriptions Disp Refills   amphetamine-dextroamphetamine (ADDERALL) 20 MG tablet 90 tablet 0    Sig: Take 1 po three times a day   Last seen 03/01/23, next appt 09/18/23 Dispenses  Routing to provider to refill Dispensed Days Supply Quantity Provider Pharmacy  AMPHETAMINE/DEXTROAMPHETAMINE  20 MG TABS 04/02/2023 30 90 tablet Sater, Pearletha Furl, MD [facility]...  AMPHETAMINE/DEXTROAMPHETAMINE  20 MG TABS 03/01/2023 30 90 tablet Sater, Pearletha Furl, MD [facility]...  AMPHETAMINE/DEXTROAMPHETAMINE  20 MG TABS 01/30/2023 30 90 tablet Sater, Pearletha Furl, MD [facility]...  AMPHETAMINE/DEXTROAMPHETAMINE  20 MG TABS 01/01/2023 30 90 tablet Penumalli, Glenford Bayley, MD [facility]...  AMPHETAMINE/DEXTROAMPHETAMINE  20 MG TABS 12/01/2022 30 90 tablet Penumalli, Glenford Bayley, MD [facility]...

## 2023-05-02 NOTE — Telephone Encounter (Signed)
Pt's wife, Chad Tran request refill for amphetamine-dextroamphetamine (ADDERALL) 20 MG tablet  send to [facility] [phone]

## 2023-06-04 ENCOUNTER — Other Ambulatory Visit: Payer: Self-pay | Admitting: Neurology

## 2023-06-04 MED ORDER — AMPHETAMINE-DEXTROAMPHETAMINE 20 MG PO TABS: ORAL_TABLET | ORAL | 0 refills | Status: DC

## 2023-06-04 NOTE — Telephone Encounter (Signed)
  Last office visit: 03/01/2023 Next appointment: 09/18/2023

## 2023-06-04 NOTE — Telephone Encounter (Signed)
Pt's wife called and LVM stating that the pt is needing a refill request for his amphetamine-dextroamphetamine (ADDERALL) 20 MG tablet and have it sent to the [facility].

## 2023-06-05 MED ORDER — AMPHETAMINE-DEXTROAMPHETAMINE 20 MG PO TABS: ORAL_TABLET | ORAL | 0 refills | Status: DC

## 2023-06-05 NOTE — Telephone Encounter (Signed)
Pt last seen 03/01/2023 Upcoming Appointment 09/18/2023  Adderall last filled 05/02/2023

## 2023-06-05 NOTE — Telephone Encounter (Signed)
Pt's wife calling back with where to resend the prescription for amphetamine-dextroamphetamine (ADDERALL) 20 MG tablet. She said she spoke to someone yesterday that told her to call back with pharmacy information where would like prescription due to [facility].  Please send to: [facility] [address] [place], [place] [address] [phone]

## 2023-06-05 NOTE — Addendum Note (Signed)
Addended by: Leandra Kern R on: 06/05/2023 11:37 AM   Modules accepted: Orders

## 2023-07-03 ENCOUNTER — Other Ambulatory Visit: Payer: Self-pay | Admitting: Neurology

## 2023-07-03 MED ORDER — AMPHETAMINE-DEXTROAMPHETAMINE 20 MG PO TABS: ORAL_TABLET | ORAL | 0 refills | Status: DC

## 2023-07-03 NOTE — Telephone Encounter (Signed)
Last seen 03/01/23 and next f/u 09/18/23. Last refilled 06/05/23 #90.

## 2023-07-03 NOTE — Telephone Encounter (Signed)
Pt request refill for amphetamine-dextroamphetamine (ADDERALL) 20 MG tablet [facility] [phone]

## 2023-08-20 ENCOUNTER — Other Ambulatory Visit: Payer: Self-pay | Admitting: Neurology

## 2023-08-20 MED ORDER — AMPHETAMINE-DEXTROAMPHETAMINE 20 MG PO TABS: ORAL_TABLET | ORAL | 0 refills | Status: DC

## 2023-08-20 NOTE — Telephone Encounter (Signed)
Last seen on 03/01/23 Follow up scheduled on 09/18/23 Last filled on 07/03/23 #90 tablets (30 day supply) Rx pending to be signed

## 2023-08-20 NOTE — Telephone Encounter (Signed)
Pt's Chad Tran request refill for amphetamine-dextroamphetamine (ADDERALL) 20 MG tablet [facility] [phone].

## 2023-09-07 ENCOUNTER — Other Ambulatory Visit: Payer: Self-pay | Admitting: Neurology

## 2023-09-10 NOTE — Telephone Encounter (Signed)
Last seen on 03/01/23 Follow up scheduled on 09/18/23

## 2023-09-18 ENCOUNTER — Encounter: Payer: Self-pay | Admitting: Neurology

## 2023-09-18 ENCOUNTER — Ambulatory Visit (INDEPENDENT_AMBULATORY_CARE_PROVIDER_SITE_OTHER): Payer: 59 | Admitting: Neurology

## 2023-09-18 VITALS — BP 156/80 | HR 61 | Ht 70.0 in | Wt 215.0 lb

## 2023-09-18 DIAGNOSIS — G309 Alzheimer's disease, unspecified: Secondary | ICD-10-CM

## 2023-09-18 DIAGNOSIS — F0280 Dementia in other diseases classified elsewhere without behavioral disturbance: Secondary | ICD-10-CM

## 2023-09-18 DIAGNOSIS — G319 Degenerative disease of nervous system, unspecified: Secondary | ICD-10-CM

## 2023-09-18 DIAGNOSIS — R4701 Aphasia: Secondary | ICD-10-CM | POA: Diagnosis not present

## 2023-09-18 DIAGNOSIS — G308 Other Alzheimer's disease: Secondary | ICD-10-CM | POA: Diagnosis not present

## 2023-09-18 DIAGNOSIS — F419 Anxiety disorder, unspecified: Secondary | ICD-10-CM

## 2023-09-18 MED ORDER — SERTRALINE HCL 50 MG PO TABS: 50.0000 mg | ORAL_TABLET | Freq: Every day | ORAL | 3 refills | Status: AC

## 2023-09-18 NOTE — Progress Notes (Signed)
 [facility]  PATIENT: Chad Tran DOB: 08-06-1961  REFERRING DOCTOR OR PCP:  Prentice Blush, MD; Elodia Pouch SOURCE: Patient, notes from primary care, imaging and laboratory reports, MRI of the brain personally reviewed.  _________________________________   HISTORICAL  CHIEF COMPLAINT:  Chief Complaint  Patient presents with   Room 11    Pt is here with his Wife. Pt states that he feels like his memory has decreased. Pt states that he is having trouble word finding. Pt states that he is having mood changes. Pt's wife states that she over help and pt gets agitated. Pt states that he doesn't feel happy.    Update 03/01/2023 Since the last visit, he has the ATN profile.  The amyloid 42/40 ratio is reduced and the pTau181 is elevated.  This pattern is very consistent with Alzheimer's disease.  However, the pattern of atrophy on the MRI (parietal atrophy, L>R, more so than medial temporal) and his difficulties with speech also make local panic dementia, a variant of Alzheimer's disease, possible.  He feels he is stable but his wife notes good days and bad days.  He does worse when he is stable.    He reports doing mechanical tasks but his wife notes tasks are taking longer.    He notes being more unorganized.  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  It shows atrophy parietal > frontal / occipital but not much atrophy in temporal lobes.     He has difficulty with language and math as well as STM.   Speech is more of a problem than memory.  He had trouble reading a tape measure.   He notes sentence construction is occasionally poor.   He has had difficutly telling time on an analog clock.   He feels cognition is worse when tired.    He has some trouble with people's names.      He feels more self conscious due to his cognitive issues.     He does more trouble with conversation if there is background noise.    Neurocognitive  but not always. in 2021 and 2022 was abnormal.  There was some progression from 2021 to 2022.   At that time, I had reviewed the results of his recent neuro cognitive testing with him and his wife.  They are consistent with Alzheimer's disease.      Besides memory, he has difficulty with focus and attention.  This is better with Adderall.   He cut the dose as he felt palpitaitons and other symptoms.    Adderall has helped cognition and ADD.     He changed the Buspar  to 06-13-09 from 15 mg po bid.   It has helped the anxiety.     He sometimes misses the middle dose.    Physically he is doing well and feels better since the inspire device with less fatigue.    He tries to eat well and enjoys a [employer] and eats vegetables and low salt.       He has severe OSA (AHI = 39.7)  and was using BiPAP nightly but had trouble tolerating it.  He has Inspire now and tolerates it well.   He is physically active and gardens daily.  Unfortunately, his business went bankrupt.  He has more  anxiety  Buspar  has helped some.  No FH of cognitive issue.  His father had PD (late 1's onset).        03/01/2023    3:58 PM 08/30/2022    3:57 PM 02/21/2022    3:46 PM  MMSE - Mini Mental State Exam  Orientation to time 3 2 2   Orientation to Place 4 5 5   Registration 3 3 3   Attention/ Calculation 1 2 2   Recall 0 2 2  Language- name 2 objects 2 2 2   Language- repeat 1 1 1   Language- follow 3 step command 3 3 3   Language- read & follow direction 1 1 1   Write a sentence 1 1 1   Copy design 1 1 1   Total score 20 23 23             Impression/Diagnosis 06/07/2021:      Overall, the results of the current neuropsychological evaluation were direct comparisons were made between the initial neuropsychological evaluation in 2021 and the most recent results this year show a significant progressive decline cognitive functions.   There is a significant decline from already impaired performance over the past 9 to 12 months.  While during the clinical interview the patient reports that he feels like he has improved some since the initial testing and has been working on treating his moderate to severe sleep apnea and reducing his use of benzodiazepine and while increasing or more consistently using psychostimulant medications, he continues to report difficulties with geographic orientation, continued difficulties with memory although visual memory can come back when he steps back from the situation.  The patient reports that he is having to take much more time to think through issues and continues to struggle with mathematical another executive functioning patterns.  Current assessment as noted below suggest further progression in a wide range of cognitive domains including global cognitive functioning, expressive language functioning decline with significant deficits, further decline and significant deficits particularly for auditory learning and memory with decline in his previous well-preserved visual memory and learning.  Encoding, storage and organization, retrieval and retention after period of delay were all noted in his significant memory deficits.  Significant executive functioning deficits were also noted for both verbal and visual modalities.  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  Even though there is a strong family history of Parkinson's disease there continue to be no significant evidence of Parkinson's dementia.  There is also only mild indications of microvascular ischemic changes and no evidence of stroke or cerebral hemorrhage.  Neuropsychological testing continue to strongly suggest involvement of parietal, temporal and frontal lobe brain regions.  While the patient's moderate to severe sleep apnea is and  difficulty treating up to this point with BiPAP machine can produce significant memory and cognitive impacts the pattern and nature and progression of his cognitive decline is seen through both subjective reports and objective assessment would go well beyond those typically seen with untreated sleep apnea.  The patient has now had the inspire hypoglossal stimulator device implanted but we do not know yet how much this is helped his overall status.  As far as treatment recommendations, the patient is showing progression and further decline in overall cognitive functioning.  There will be increasing concerns about his continuation of driving as noted cognitive deficits and further decline are noted for visual spatial and visual judgment capacity, visual searching and speed of mental operations, and other executive functioning declines.  I will also sit down with the patient and his family and go over future planning and adjustments that can be made as well as making the information available to his neurologist as far as potential medication strategies.  The primary medication he is taking at this point is Adderall to address his sleepiness and fatigue and reports that he has been reducing the amount of benzo diazepam he has been taking.    HISTORY OF COGNITIVE ISSUES:  I first saw him 04/22/2019.  At the time he presented with processing and attentional issues for the previous 6-12 months.   He noted planning and executing tasks were more difficult.  He has noted that he has had difficulty telling time on an analog clock.      He notes some difficulty with memory but has had more problems with processing the memory.  He is driving but feels he needs to pay much more attention than he used to.  He has made some errors in driving form point to point but has never been completely lost.   He has tinnitus and has often needed to focus more during conversatoin.   This is a little worse.      He has had problems  coming up with the right words. He writes for fun and this is much more difficult.    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   He used to be good at math and now has difficulty.   He can't estimate like he used to and is reliant on a a calculator.      He owns a financial controller.  He noted more trouble running it in 2020.   He needs to work > 40 hours many weeks.     He has more trouble estimating jobs.    MRI of the brain 03/28/2019 was interpreted as showing mild chronic microvascular ischemic change and was otherwise normal.  However, I personally reviewed the MRI of the brain dated 03/28/2019 and feel that there is very significant atrophy, much more than expected for age especially in the parietal lobes, to lesser extent in the occipital and frontal lobes.  The medial temporal lobes did not show any atrophy.  There is also mild chronic microvascular ischemic change, a little more than expected for age.  I also reviewed recent lab work.  B12 and TSH and other labs are normal.  Cognitive testing by neuropsychology in 2021 and 2022 was consistent with Alzheimer's disease.  Genetic testing:   We checked APoE genotype:  he is E3/E3  ATM profile, reduce amyloid beta 42/40 ratio and elevated pTau181.  NFL was normal.  Vascular risk factors: Hypertension.     04/22/2019    6:44 PM  Montreal Cognitive Assessment   Visuospatial/ Executive (0/5) 3  Naming (0/3) 3  Attention: Read list of digits (0/2) 2  Attention: Read list of letters (0/1) 1  Attention: Serial 7 subtraction starting at 100 (0/3) 1  Language: Repeat phrase (0/2) 2  Language : Fluency (0/1) 0  Abstraction (0/2) 2  Delayed Recall (0/5) 0  Orientation (0/6) 6  Total 20  Adjusted Score (based on education) 20       03/01/2023    3:58 PM 08/30/2022    3:57 PM 02/21/2022    3:46 PM  MMSE - Mini Mental State Exam  Orientation to time 3 2 2   Orientation to Place 4 5 5   Registration 3 3 3   Attention/ Calculation 1  2  2  Recall 0 2 2  Language- name 2 objects 2 2 2   Language- repeat 1 1 1   Language- follow 3 step command 3 3 3   Language- read & follow direction 1 1 1   Write a sentence 1 1 1   Copy design 1 1 1   Total score 20 23 23       REVIEW OF SYSTEMS: Constitutional: No fevers, chills, sweats, or change in appetite Eyes: No visual changes, double vision, eye pain Ear, nose and throat: No hearing loss, ear pain, nasal congestion, sore throat Cardiovascular: No chest pain, palpitations Respiratory:  No shortness of breath at rest or with exertion.   No wheezes.  His wife notes severe snoring and pauses in his breathing at night. GastrointestinaI: No nausea, vomiting, diarrhea, abdominal pain, fecal incontinence Genitourinary:  No dysuria, urinary retention or frequency.  No nocturia. Musculoskeletal:  No neck pain, back pain Integumentary: No rash, pruritus, skin lesions Neurological: as above Psychiatric: No depression at this time.  No anxiety Endocrine: No palpitations, diaphoresis, change in appetite, change in weigh or increased thirst Hematologic/Lymphatic:  No anemia, purpura, petechiae. Allergic/Immunologic: No itchy/runny eyes, nasal congestion, recent allergic reactions, rashes  ALLERGIES: No Known Allergies  HOME MEDICATIONS:  Current Outpatient Medications:    amphetamine -dextroamphetamine  (ADDERALL) 20 MG tablet, Take 1 tablet by mouth three times daily (Patient taking differently: Take 20 mg by mouth 3 (three) times daily. Take 1/2 tablet by mouth three times daily), Disp: 90 tablet, Rfl: 0   busPIRone  (BUSPAR ) 15 MG tablet, TAKE 1 TABLET BY MOUTH 2 TIMES A DAY, Disp: 60 tablet, Rfl: 0   Cholecalciferol (VITAMIN D -3) 125 MCG (5000 UT) TABS, Take by mouth., Disp: , Rfl:    donepezil  (ARICEPT ) 10 MG tablet, TAKE TWO TABLETS BY MOUTH EVERY EVENING, Disp: 60 tablet, Rfl: 5   ramipril (ALTACE) 5 MG capsule, Take 20 mg by mouth 2 (two) times daily., Disp: , Rfl:    rosuvastatin  (CRESTOR) 10 MG tablet, Take 10 mg by mouth daily., Disp: , Rfl:    tadalafil (CIALIS) 20 MG tablet, Take 10 mg by mouth as needed., Disp: , Rfl:    vitamin B-12 (CYANOCOBALAMIN) 50 MCG tablet, Take 50 mcg by mouth daily., Disp: , Rfl:    ALPRAZolam (XANAX) 0.25 MG tablet, Take 1 tablet by mouth 3 (three) times daily. (Patient not taking: Reported on 09/18/2023), Disp: , Rfl:    atorvastatin (LIPITOR) 80 MG tablet, Take 80 mg by mouth daily. (Patient not taking: Reported on 09/18/2023), Disp: , Rfl:    loteprednol (LOTEMAX) 0.5 % ophthalmic suspension, Place 1 drop into the left eye daily. (Patient not taking: Reported on 09/18/2023), Disp: , Rfl:    Multiple Vitamin tablet, Take by mouth. (Patient not taking: Reported on 09/18/2023), Disp: , Rfl:   PAST MEDICAL HISTORY: Past Medical History:  Diagnosis Date   Anxiety    BPH (benign prostatic hyperplasia)    Decreased libido    Difficulty concentrating    Erectile dysfunction    GERD (gastroesophageal reflux disease)    Hyperlipidemia    Hypertension    Lipoma of back    Senile nuclear sclerosis     PAST SURGICAL HISTORY: Past Surgical History:  Procedure Laterality Date   arm surgery Left    COLONOSCOPY     CORNEAL TRANSPLANT Bilateral    CYST REMOVAL HAND     chest and back as well   HERNIA REPAIR Bilateral     FAMILY HISTORY: Family  History  Problem Relation Age of Onset   Lung cancer Father     SOCIAL HISTORY:  Social History   Socioeconomic History   Marital status: Married    Spouse name: Not on file   Number of children: Not on file   Years of education: Not on file   Highest education level: Not on file  Occupational History   Not on file  Tobacco Use   Smoking status: Former   Smokeless tobacco: Never  Substance and Sexual Activity   Alcohol use: Yes    Comment: weekends   Drug use: Never   Sexual activity: Not on file  Other Topics Concern   Not on file  Social History Narrative   Lives with wife    Caffeine use: 2 cups coffee per day, tea at night   Right handed    Social Drivers of Corporate Investment Banker Strain: Not on file  Food Insecurity: No Food Insecurity (03/13/2022)   [facility], [facility]   Hunger Vital Sign    Worried About Running Out of Food in the Last Year: Never true    Ran Out of Food in the Last Year: Never true  Transportation Needs: Not on file  Physical Activity: Not on file  Stress: No Stress Concern Present (03/14/2021)   Received from [facility], [facility]   [employer] [facility] - Occupational Stress Questionnaire    Feeling of Stress : Not at all  Social Connections: Unknown (01/13/2022)   Received from [facility], [facility]   Social Network    Social Network: Not on file  Intimate Partner Violence: Unknown (12/06/2021)   Received from [facility], [facility]   HITS    Physically Hurt: Not on file    Insult or Talk Down To: Not on file    Threaten Physical Harm: Not on file    Scream or Curse: Not on file     PHYSICAL EXAM  Vitals:   09/18/23 1500  BP: (!) 156/80  Pulse: 61  Weight: 215 lb (97.5 kg)  Height: 5' 10 (1.778 m)      Body mass index is 30.85 kg/m.   General: The patient is well-developed and well-nourished and in no acute distress  HEENT:  Head is [place]/AT.  Sclera are anicteric.    Skin: Extremities are without rash or  edema.  Neurologic Exam  Mental status: Mild expressive aphasia.   Mild reduced focus.   No finger agnosia.   Good L/R.  One error with long sentence repetition   Cranial nerves: Extraocular movements are full OD.  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  The right pupil is postoperative and he has reduced right vision. Facial symmetry is present. There is good facial sensation to soft touch bilaterally.Facial strength is normal.  Trapezius and sternocleidomastoid strength is normal. No dysarthria is noted.  No obvious hearing deficits are noted.  Motor:   Muscle bulk is normal.   Tone is normal. Strength is  5 / 5 in all 4 extremities.   Sensory: Sensory testing is intact to pinprick, soft touch and vibration sensation in all 4 extremities.  Coordination: Cerebellar testing reveals good finger-nose-finger and heel-to-shin bilaterally.  Gait and station: Station is normal.  The gait is normal.  Mildly wide tandem gait.  Romberg is negative.  Reflexes: Deep tendon reflexes are symmetric and normal bilaterally.        DIAGNOSTIC DATA (LABS, IMAGING, TESTING) - I reviewed patient records,  labs, notes, testing and imaging myself where available.  Lab Results  Component Value Date   WBC 7.5 03/25/2008   HGB 15.7 03/25/2008   HCT 47.3 03/25/2008   MCV 95.4 03/25/2008   PLT 252 03/25/2008      Component Value Date/Time   NA 138 03/25/2008 0913   K 5.1 MARKED HEMOLYSIS 03/25/2008 0913   CL 107 03/25/2008 0913   CO2 24 03/25/2008 0913   GLUCOSE 92 03/25/2008 0913   BUN 18 03/25/2008 0913   CREATININE 0.70 03/25/2008 0913   CALCIUM 9.9 03/25/2008 0913   GFRNONAA >60 03/25/2008 0913   GFRAA  03/25/2008 0913    >60        The eGFR has been calculated using the MDRD equation. This calculation has not been validated in all clinical       ASSESSMENT AND PLAN    1. Logopenic nonamnestic Alzheimer disease (HCC)   2. Alzheimer's disease, unspecified (CODE) (HCC)   3. Aphasia   4. Brain atrophy (HCC)   5. Anxiety        1.  He has AD or related variant (logopenic PPA?).    We will check MRI to determine if other diagnosis and to determine if candidate for Lequembi.  He will continue donepezil .  Consider memantine. 2.   He is permanently disabled due to his significant cognitive disorder, possibly Alzheimer's disease.  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  He would be unable to learn skills to do a more physical job.  Additionally, he has language disturbances which would make finding  gainful employment impossible. 3.  Adderall 10-20 mg 3 times daily .  This has helped processing and attention.  Add Zoloft  to  BuSpar  for mood. 4.   Stay active and exercise as tolerated. 5..  Return in 6 months or sooner for new or worsening neurologic symptoms.  42 minute office visit with the majority of the time spent face-to-face for history and physical, discussion/counseling and decision-making.  Imaging studies and lab results were reviewed personally.  Additional time with record review and documentation.  Andriana Casa A. Vear, MD, PhD, FAAN Certified in Neurology, Clinical Neurophysiology, Sleep Medicine, Pain Medicine and Neuroimaging Director, [facility] at [facility]  [facility] [address], [address] [place], [place] [address] [phone]

## 2023-09-19 ENCOUNTER — Telehealth: Payer: Self-pay | Admitting: Neurology

## 2023-09-19 NOTE — Telephone Encounter (Signed)
 Chad Tran: [id number] exp. 09/18/23-03/16/24 sent to [facility]. [phone]

## 2023-10-16 ENCOUNTER — Other Ambulatory Visit: Payer: Self-pay | Admitting: Neurology

## 2023-10-18 ENCOUNTER — Other Ambulatory Visit: Payer: Self-pay

## 2023-10-18 ENCOUNTER — Other Ambulatory Visit: Payer: Self-pay | Admitting: Neurology

## 2023-10-18 MED ORDER — AMPHETAMINE-DEXTROAMPHETAMINE 20 MG PO TABS: ORAL_TABLET | ORAL | 0 refills | Status: AC

## 2023-10-18 NOTE — Telephone Encounter (Signed)
Pt Last Seen 09/18/2023 Upcoming Appointment 04/01/2024  Adderall 20mg  Last Filled 08/20/2023

## 2023-10-18 NOTE — Telephone Encounter (Signed)
Pt is requesting a refill for amphetamine-dextroamphetamine (ADDERALL) 20 MG tablet.  Pharmacy: [facility] [phone]

## 2023-11-13 ENCOUNTER — Other Ambulatory Visit: Payer: Self-pay | Admitting: Neurology

## 2023-12-11 ENCOUNTER — Other Ambulatory Visit: Payer: Self-pay | Admitting: Neurology

## 2023-12-11 NOTE — Telephone Encounter (Signed)
 Last seen on 09/18/23 Follow up scheduled on 04/01/24

## 2023-12-25 ENCOUNTER — Encounter: Payer: Self-pay | Admitting: Neurology

## 2023-12-25 DIAGNOSIS — G3184 Mild cognitive impairment, so stated: Secondary | ICD-10-CM

## 2023-12-25 NOTE — Telephone Encounter (Signed)
 LVM to call back and discuss labwork patient and wife are asking about Per Dr Godwin Lat placed order for ATN profile and can come during office hours to get labwork

## 2023-12-25 NOTE — Telephone Encounter (Signed)
 Wife is asking for a call back from [place], [place]

## 2023-12-25 NOTE — Telephone Encounter (Signed)
 Spoke to wife (checked DPR) Per Dr Godwin Lat placed order for ATN profile (bloodwork) Made wife aware [facility] hours Per wife heard about bloodwork on [employer] this am but didn't know name of labs . Wife said will bring patient by next week to get bloodwork drawn . Wife thanked me for calling

## 2024-02-04 ENCOUNTER — Ambulatory Visit (HOSPITAL_COMMUNITY)
Admission: RE | Admit: 2024-02-04 | Discharge: 2024-02-04 | Disposition: A | Discharge status: Home / Self Care | Payer: 59 | Source: Ambulatory Visit | Attending: Neurology | Admitting: Neurology

## 2024-02-04 ENCOUNTER — Encounter (HOSPITAL_COMMUNITY): Payer: Self-pay

## 2024-02-04 DIAGNOSIS — G309 Alzheimer's disease, unspecified: Secondary | ICD-10-CM

## 2024-02-06 ENCOUNTER — Ambulatory Visit (HOSPITAL_COMMUNITY)
Admission: RE | Admit: 2024-02-06 | Discharge: 2024-02-06 | Disposition: A | Discharge status: Home / Self Care | Source: Ambulatory Visit | Attending: Neurology | Admitting: Neurology

## 2024-02-06 DIAGNOSIS — G309 Alzheimer's disease, unspecified: Secondary | ICD-10-CM | POA: Diagnosis present

## 2024-02-11 ENCOUNTER — Ambulatory Visit: Payer: Self-pay | Admitting: Neurology

## 2024-02-11 NOTE — Telephone Encounter (Signed)
-----   Message from Jorie Newness sent at 02/11/2024  2:14 PM EDT ----- Please let them know that the MRI of the brain looked about the same as 2 and half years ago-it shows atrophy and some age-related change but this has not changed over the interval.

## 2024-02-11 NOTE — Telephone Encounter (Signed)
 Left message for patient to call.

## 2024-02-11 NOTE — Telephone Encounter (Signed)
 Pt has returned call to RMA

## 2024-03-19 ENCOUNTER — Other Ambulatory Visit

## 2024-03-19 DIAGNOSIS — G3184 Mild cognitive impairment, so stated: Secondary | ICD-10-CM

## 2024-03-22 ENCOUNTER — Ambulatory Visit: Payer: Self-pay | Admitting: Neurology

## 2024-03-22 LAB — ATN PROFILE
A -- Beta-amyloid 42/40 Ratio: 0.101 — AB (ref >0.102)
Beta-amyloid 40: 218.38 pg/mL
Beta-amyloid 42: 22.05 pg/mL
N -- NfL, Plasma: 2.96 pg/mL (ref 0.00–3.65)
T -- p-tau181: 1.3 pg/mL — AB (ref 0.00–0.97)

## 2024-04-01 ENCOUNTER — Ambulatory Visit (INDEPENDENT_AMBULATORY_CARE_PROVIDER_SITE_OTHER): Payer: 59 | Admitting: Neurology

## 2024-04-01 ENCOUNTER — Encounter: Payer: Self-pay | Admitting: Neurology

## 2024-04-01 VITALS — BP 138/81 | HR 53 | Ht 70.0 in | Wt 215.6 lb

## 2024-04-01 DIAGNOSIS — G319 Degenerative disease of nervous system, unspecified: Secondary | ICD-10-CM

## 2024-04-01 DIAGNOSIS — G308 Other Alzheimer's disease: Secondary | ICD-10-CM

## 2024-04-01 DIAGNOSIS — G309 Alzheimer's disease, unspecified: Secondary | ICD-10-CM | POA: Diagnosis not present

## 2024-04-01 DIAGNOSIS — F419 Anxiety disorder, unspecified: Secondary | ICD-10-CM

## 2024-04-01 DIAGNOSIS — F0280 Dementia in other diseases classified elsewhere without behavioral disturbance: Secondary | ICD-10-CM

## 2024-04-01 NOTE — Progress Notes (Signed)
 [facility]  PATIENT: Chad Tran DOB: June 14, 1961  REFERRING DOCTOR OR PCP:  Prentice Blush, MD; Elodia Pouch SOURCE: Patient, notes from primary care, imaging and laboratory reports, MRI of the brain personally reviewed.  _________________________________   HISTORICAL  CHIEF COMPLAINT:  Chief Complaint  Patient presents with   Follow-up    Pt in room 10.wife in room. Here for Logopenic nonamnestic Alzheimer disease. Wife said verbal communication has declined.    Update 04/01/2024 Since the last visit, he has the ATN profile which shows biomarker abnormalities.   The amyloid 42/40 ratio is reduced and the pTau181 is elevated.  This pattern is very consistent with Alzheimer's disease or a variant such as low logopenic PPA.  MRI shows parietal atrophy, L>R, more so than medial temporal) and his difficulties with speech also make logopenic PPA, a variant of Alzheimer's disease, probable.  His wife has noted further progression with reduced word finding and other cognitive tasks.  He is misplacing items and has become more quiet.   Tasks are more difficult and slower.  He notes being more unorganized.  Progression has been slow.     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   Speech is more of a problem than memory, though memory difficulties have progressed since.  He had trouble reading .   He notes sentence construction is occasionally poor.   He has had difficutly telling time on an analog clock.    He has some trouble with people's names.      He feels more self conscious due to his cognitive issues and has become more withdrawn.  Anxiety is a little bit better this year than last year.  He stopped donepezil , memantine and sertraline    Mood has done well.  We tried Adderall which may have helped slightly but he felt agitated and  discontinued.  Neurocognitive  testing in 2021 and 2022 was abnormal.  There was some progression from 2021 to 2022.    Physically he is doing well and feels better since the inspire device with less fatigue.  He tries to eat well and enjoys a [employer] and eats vegetables and low salt.     He has severe OSA (AHI = 39.7)  and was using BiPAP nightly but had trouble tolerating it.  He has Inspire now and tolerates it well.   He is physically active and gardens daily.  Unfortunately, his business went bankrupt.  No FH of cognitive issue.  His father had PD (late 86's onset).        04/01/2024    4:14 PM 03/01/2023    3:58 PM 08/30/2022    3:57 PM  MMSE - Mini Mental State Exam  Orientation to time 0 3 2  Orientation to Place 0 4 5  Registration 3 3 3   Attention/ Calculation 0 1 2  Recall 3 0 2  Language- name 2 objects 2 2 2   Language- repeat 0 1 1  Language- follow 3 step command 3 3 3   Language- read & follow direction 1 1 1   Write a sentence 1 1 1   Copy design 1 1 1   Total score 14 20 23       04/22/2019    6:44 PM  Montreal Cognitive Assessment   Visuospatial/ Executive (0/5) 3  Naming (0/3) 3  Attention: Read list of digits (  0/2) 2  Attention: Read list of letters (0/1) 1  Attention: Serial 7 subtraction starting at 100 (0/3) 1  Language: Repeat phrase (0/2) 2  Language : Fluency (0/1) 0  Abstraction (0/2) 2  Delayed Recall (0/5) 0  Orientation (0/6) 6  Total 20  Adjusted Score (based on education) 20    Impression/Diagnosis 06/07/2021:      Overall, the results of the current neuropsychological evaluation were direct comparisons were made between the initial neuropsychological evaluation in 2021 and the most recent results this year show a significant progressive decline cognitive functions.  There is a significant decline from already impaired performance over the past 9 to 12 months.  While during the clinical interview the patient reports that he feels like he  has improved some since the initial testing and has been working on treating his moderate to severe sleep apnea and reducing his use of benzodiazepine and while increasing or more consistently using psychostimulant medications, he continues to report difficulties with geographic orientation, continued difficulties with memory although visual memory can come back when he steps back from the situation.  The patient reports that he is having to take much more time to think through issues and continues to struggle with mathematical another executive functioning patterns.  Current assessment as noted below suggest further progression in a wide range of cognitive domains including global cognitive functioning, expressive language functioning decline with significant deficits, further decline and significant deficits particularly for auditory learning and memory with decline in his previous well-preserved visual memory and learning.  Encoding, storage and organization, retrieval and retention after period of delay were all noted in his significant memory deficits.  Significant executive functioning deficits were also noted for both verbal and visual modalities.  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  Even though there is a strong family history of Parkinson's disease there continue to be no significant evidence of Parkinson's dementia.  There is also only mild indications of microvascular ischemic changes and no evidence of stroke or cerebral hemorrhage.  Neuropsychological testing continue to strongly suggest involvement of parietal, temporal and frontal lobe brain regions.  While the patient's moderate to severe sleep apnea is and difficulty treating up to this point with BiPAP machine can produce significant memory and cognitive impacts the pattern and nature and progression of his cognitive decline is  seen through both subjective reports and objective assessment would go well beyond those typically seen with untreated sleep apnea.  The patient has now had the inspire hypoglossal stimulator device implanted but we do not know yet how much this is helped his overall status.  As far as treatment recommendations, the patient is showing progression and further decline in overall cognitive functioning.  There will be increasing concerns about his continuation of driving as noted cognitive deficits and further decline are noted for visual spatial and visual judgment capacity, visual searching and speed of mental operations, and other executive functioning declines.  I will also sit down with the patient and his family and go over future planning and adjustments that can be made as well as making the information available to his neurologist as far as potential medication strategies.  The primary medication he is taking at this point is Adderall to address his sleepiness and fatigue and reports that he has been reducing the amount of benzo diazepam he has been taking.    HISTORY OF COGNITIVE  ISSUES:  I first saw him 04/22/2019.  At the time he presented with processing and attentional issues for the previous 6-12 months.   He noted planning and executing tasks were more difficult.  He has noted that he has had difficulty telling time on an analog clock.      He notes some difficulty with memory but has had more problems with processing the memory.  He is driving but feels he needs to pay much more attention than he used to.  He has made some errors in driving form point to point but has never been completely lost.   He has tinnitus and has often needed to focus more during conversatoin.   This is a little worse.      He has had problems coming up with the right words. He writes for fun and this is much more difficult.    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   He used to be good at math and now  has difficulty.   He can't estimate like he used to and is reliant on a a calculator.      He owns a [employer].  He noted more trouble running it in 2020.   He needs to work > 40 hours many weeks.     He has more trouble estimating jobs.    MRI of the brain 03/28/2019 was interpreted as showing mild chronic microvascular ischemic change and was otherwise normal.  However, I personally reviewed the MRI of the brain dated 03/28/2019 and feel that there is very significant atrophy, much more than expected for age especially in the parietal lobes, to lesser extent in the occipital and frontal lobes.  The medial temporal lobes did not show any atrophy.  There is also mild chronic microvascular ischemic change, a little more than expected for age.  I also reviewed recent lab work.  B12 and TSH and other labs are normal.  Cognitive testing by neuropsychology in 2021 and 2022 was consistent with Alzheimer's disease.  Genetic testing:   We checked APoE genotype:  he is E3/E3  ATM profile, reduce amyloid beta 42/40 ratio and elevated pTau181.  NFL was normal.  Vascular risk factors: Hypertension.     04/22/2019    6:44 PM  Montreal Cognitive Assessment   Visuospatial/ Executive (0/5) 3  Naming (0/3) 3  Attention: Read list of digits (0/2) 2  Attention: Read list of letters (0/1) 1  Attention: Serial 7 subtraction starting at 100 (0/3) 1  Language: Repeat phrase (0/2) 2  Language : Fluency (0/1) 0  Abstraction (0/2) 2  Delayed Recall (0/5) 0  Orientation (0/6) 6  Total 20  Adjusted Score (based on education) 20       04/01/2024    4:14 PM 03/01/2023    3:58 PM 08/30/2022    3:57 PM  MMSE - Mini Mental State Exam  Orientation to time 0 3 2  Orientation to Place 0 4 5  Registration 3 3 3   Attention/ Calculation 0 1 2  Recall 3 0 2  Language- name 2 objects 2 2 2   Language- repeat 0 1 1  Language- follow 3 step command 3 3 3   Language- read & follow direction 1 1 1   Write a sentence 1  1 1   Copy design 1 1 1   Total score 14 20 23       REVIEW OF SYSTEMS: Constitutional: No fevers, chills, sweats, or change in appetite Eyes: No  visual changes, double vision, eye pain Ear, nose and throat: No hearing loss, ear pain, nasal congestion, sore throat Cardiovascular: No chest pain, palpitations Respiratory:  No shortness of breath at rest or with exertion.   No wheezes.  His wife notes severe snoring and pauses in his breathing at night. GastrointestinaI: No nausea, vomiting, diarrhea, abdominal pain, fecal incontinence Genitourinary:  No dysuria, urinary retention or frequency.  No nocturia. Musculoskeletal:  No neck pain, back pain Integumentary: No rash, pruritus, skin lesions Neurological: as above Psychiatric: No depression at this time.  No anxiety Endocrine: No palpitations, diaphoresis, change in appetite, change in weigh or increased thirst Hematologic/Lymphatic:  No anemia, purpura, petechiae. Allergic/Immunologic: No itchy/runny eyes, nasal congestion, recent allergic reactions, rashes  ALLERGIES: No Known Allergies  HOME MEDICATIONS:  Current Outpatient Medications:    Cholecalciferol (VITAMIN D -3) 125 MCG (5000 UT) TABS, Take by mouth. (Patient taking differently: Take 50,000 Units by mouth daily. 2 tablets daily), Disp: , Rfl:    loteprednol (LOTEMAX) 0.5 % ophthalmic suspension, Place 1 drop into the left eye daily., Disp: , Rfl:    MAGNESIUM GLUCONATE PO, Take 360 mg by mouth 2 (two) times daily., Disp: , Rfl:    Multiple Vitamin tablet, Take by mouth., Disp: , Rfl:    omega-3 acid ethyl esters (LOVAZA) 1 g capsule, Take 1 g by mouth daily., Disp: , Rfl:    OVER THE COUNTER MEDICATION, K-2 + D3 100 MCG 1 PO TABLET DAILY  PROBIOTIC 1 PO DAILY  Prevagin 1 po daily, Disp: , Rfl:    ramipril (ALTACE) 5 MG capsule, Take 20 mg by mouth 2 (two) times daily., Disp: , Rfl:    rosuvastatin (CRESTOR) 10 MG tablet, Take 10 mg by mouth daily., Disp: , Rfl:     tadalafil (CIALIS) 20 MG tablet, Take 10 mg by mouth as needed., Disp: , Rfl:    vitamin B-12 (CYANOCOBALAMIN) 50 MCG tablet, Take 50 mcg by mouth daily., Disp: , Rfl:    ALPRAZolam (XANAX) 0.25 MG tablet, Take 1 tablet by mouth 3 (three) times daily. (Patient not taking: Reported on 04/01/2024), Disp: , Rfl:    amphetamine -dextroamphetamine  (ADDERALL) 20 MG tablet, Take 1 tablet by mouth three times daily (Patient not taking: Reported on 04/01/2024), Disp: 90 tablet, Rfl: 0   atorvastatin (LIPITOR) 80 MG tablet, Take 80 mg by mouth daily. (Patient not taking: Reported on 04/01/2024), Disp: , Rfl:    busPIRone  (BUSPAR ) 15 MG tablet, TAKE 1 TABLET BY MOUTH 2 TIMES A DAY (Patient not taking: Reported on 04/01/2024), Disp: 60 tablet, Rfl: 3   donepezil  (ARICEPT ) 10 MG tablet, TAKE TWO TABLETS BY MOUTH EVERY EVENING (Patient not taking: Reported on 04/01/2024), Disp: 60 tablet, Rfl: 5   sertraline  (ZOLOFT ) 50 MG tablet, Take 1 tablet (50 mg total) by mouth daily. (Patient not taking: Reported on 04/01/2024), Disp: 90 tablet, Rfl: 3  PAST MEDICAL HISTORY: Past Medical History:  Diagnosis Date   Anxiety    BPH (benign prostatic hyperplasia)    Decreased libido    Difficulty concentrating    Erectile dysfunction    GERD (gastroesophageal reflux disease)    Hyperlipidemia    Hypertension    Lipoma of back    Senile nuclear sclerosis     PAST SURGICAL HISTORY: Past Surgical History:  Procedure Laterality Date   arm surgery Left    COLONOSCOPY     CORNEAL TRANSPLANT Bilateral    CYST REMOVAL HAND     chest and back  as well   HERNIA REPAIR Bilateral     FAMILY HISTORY: Family History  Problem Relation Age of Onset   Lung cancer Father     SOCIAL HISTORY:  Social History   Socioeconomic History   Marital status: Married    Spouse name: Not on file   Number of children: Not on file   Years of education: Not on file   Highest education level: Not on file  Occupational History   Not on  file  Tobacco Use   Smoking status: Former   Smokeless tobacco: Never  Vaping Use   Vaping status: Never Used  Substance and Sexual Activity   Alcohol use: Yes    Comment: weekends   Drug use: Never   Sexual activity: Not on file  Other Topics Concern   Not on file  Social History Narrative   Lives with wife   Caffeine use: 2 cups coffee per day, tea at night   Right handed    Social Drivers of Health   Financial Resource Strain: Low Risk  (11/20/2023)   Received from [facility]   Overall Financial Resource Strain (CARDIA)    Difficulty of Paying Living Expenses: Not hard at all  Food Insecurity: No Food Insecurity (11/20/2023)   [facility]   Hunger Vital Sign    Within the past 12 months, you worried that your food would run out before you got the money to buy more.: Never true    Within the past 12 months, the food you bought just didn't last and you didn't have money to get more.: Never true  Transportation Needs: No Transportation Needs (11/20/2023)   Received from [facility] - Transportation    Lack of Transportation (Medical): No    Lack of Transportation (Non-Medical): No  Physical Activity: Insufficiently Active (11/20/2023)   Received from [facility]   Exercise Vital Sign    On average, how many days per week do you engage in moderate to strenuous exercise (like a brisk walk)?: 3 days    On average, how many minutes do you engage in exercise at this level?: 30 min  Stress: No Stress Concern Present (11/20/2023)   Received from [facility] - Occupational Stress Questionnaire    Feeling of Stress : Not at all  Social Connections: Socially Integrated (11/20/2023)   Received from [facility]   Social Network    How would you rate your social network (family, work, friends)?: Good participation with social networks  Intimate Partner Violence: Not At Risk (11/20/2023)   Received from [facility]   HITS    Over the last 12 months how often did your partner physically hurt you?: Never    Over the last 12 months how often did your partner insult you or talk down to you?: Never    Over the last 12 months how often did your partner threaten you with physical harm?: Never    Over the last 12 months how often did your partner scream or curse at you?: Never     PHYSICAL EXAM  Vitals:   04/01/24 1526  BP: 138/81  Pulse: (!) 53  Weight: 215 lb 9.6 oz (97.8 kg)  Height: 5' 10 (1.778 m)      Body mass index is 30.94 kg/m.   General: The patient is well-developed and well-nourished and in no acute distress  HEENT:  Head is [place]/AT.  Sclera are  anicteric.    Skin: Extremities are without rash or  edema.  Neurologic Exam  Mental status: She has a mild expressive aphasia.   Reduced focus and short-term memory MMSE score was 14 today.  Cranial nerves: Extraocular movements are full OD.  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  The right pupil is postoperative and he has reduced right vision. Facial symmetry is present. There is good facial sensation to soft touch bilaterally.Facial strength is normal.  Trapezius and sternocleidomastoid strength is normal. No dysarthria is noted.  No obvious hearing deficits are noted.  Motor:  Muscle bulk is normal.   Tone is normal. Strength is  5 / 5 in all 4 extremities.   Sensory: Sensory testing is intact to pinprick, soft touch and vibration sensation in all 4 extremities.  Coordination: Cerebellar testing reveals good finger-nose-finger and heel-to-shin bilaterally.  Gait and station: Station is normal.  The gait is normal.  Mildly wide tandem gait.  Romberg is negative.  Reflexes: Deep tendon reflexes are symmetric and normal bilaterally.        DIAGNOSTIC DATA (LABS, IMAGING, TESTING) - I reviewed patient records, labs, notes, testing and imaging myself where available.  Lab Results  Component Value Date   WBC 7.5 03/25/2008   HGB 15.7  03/25/2008   HCT 47.3 03/25/2008   MCV 95.4 03/25/2008   PLT 252 03/25/2008      Component Value Date/Time   NA 138 03/25/2008 0913   K 5.1 MARKED HEMOLYSIS 03/25/2008 0913   CL 107 03/25/2008 0913   CO2 24 03/25/2008 0913   GLUCOSE 92 03/25/2008 0913   BUN 18 03/25/2008 0913   CREATININE 0.70 03/25/2008 0913   CALCIUM 9.9 03/25/2008 0913   GFRNONAA >60 03/25/2008 0913   GFRAA  03/25/2008 0913    >60        The eGFR has been calculated using the MDRD equation. This calculation has not been validated in all clinical       ASSESSMENT AND PLAN    1. Alzheimer's disease, unspecified (CODE) (HCC)   2. Logopenic nonamnestic Alzheimer disease (HCC)   3. Brain atrophy (HCC)   4. Anxiety      1.  He mostly has an Alzheimer's disease variant variant (logopenic PPA) affecting language more than other cognitive domains.    We had discussed treatment options.  He stopped the donepezil  and memantine.  I recommended that he go back on the donepezil .  We discussed treatment options.  Because of his MMSE score, he is not currently a candidate to do antiamyloid monoclonal antibody treatment.   2.   He is permanently disabled due to dementia  3.   Stay active and exercise as tolerated. 5..  Return in 6 months or sooner for new or worsening neurologic symptoms.  43 minute office visit with the majority of the time spent face-to-face for history and physical, discussion/counseling and decision-making.  Imaging studies and lab results were reviewed personally.  Additional time with record review and documentation.  This visit is part of a comprehensive longitudinal care medical relationship regarding the patients primary diagnosis of dementia and related concerns.   Malicia Blasdel A. Vear, MD, PhD, FAAN Certified in Neurology, Clinical Neurophysiology, Sleep Medicine, Pain Medicine and Neuroimaging Director, [facility]  [facility] [address], [address] [place], [place] [address] [phone]

## 2024-05-26 ENCOUNTER — Encounter: Payer: Self-pay | Admitting: Neurology

## 2024-05-28 MED ORDER — DONEPEZIL HCL 10 MG PO TABS: 20.0000 mg | ORAL_TABLET | Freq: Every evening | ORAL | 5 refills | Status: AC

## 2025-04-01 ENCOUNTER — Ambulatory Visit: Admitting: Neurology
